# Patient Record
Sex: Male | Born: 1960 | Race: Black or African American | Hispanic: No | Marital: Single | Smoking: Former smoker
Health system: Southern US, Community
[De-identification: ages and names within clinical notes are randomized; demographics above are authoritative.]

## PROBLEM LIST (undated history)

## (undated) DIAGNOSIS — S4292XA Fracture of left shoulder girdle, part unspecified, initial encounter for closed fracture: Secondary | ICD-10-CM

## (undated) DIAGNOSIS — R030 Elevated blood-pressure reading, without diagnosis of hypertension: Secondary | ICD-10-CM

## (undated) DIAGNOSIS — IMO0001 Reserved for inherently not codable concepts without codable children: Secondary | ICD-10-CM

## (undated) DIAGNOSIS — A159 Respiratory tuberculosis unspecified: Secondary | ICD-10-CM

## (undated) DIAGNOSIS — K2970 Gastritis, unspecified, without bleeding: Secondary | ICD-10-CM

## (undated) DIAGNOSIS — J310 Chronic rhinitis: Secondary | ICD-10-CM

## (undated) DIAGNOSIS — D126 Benign neoplasm of colon, unspecified: Secondary | ICD-10-CM

## (undated) DIAGNOSIS — R51 Headache: Secondary | ICD-10-CM

## (undated) DIAGNOSIS — R7401 Elevation of levels of liver transaminase levels: Secondary | ICD-10-CM

## (undated) DIAGNOSIS — R740 Nonspecific elevation of levels of transaminase and lactic acid dehydrogenase [LDH]: Secondary | ICD-10-CM

## (undated) DIAGNOSIS — L309 Dermatitis, unspecified: Secondary | ICD-10-CM

## (undated) DIAGNOSIS — Z720 Tobacco use: Secondary | ICD-10-CM

## (undated) HISTORY — DX: Benign neoplasm of colon, unspecified: D12.6

## (undated) HISTORY — DX: Elevated blood-pressure reading, without diagnosis of hypertension: R03.0

## (undated) HISTORY — DX: Dermatitis, unspecified: L30.9

## (undated) HISTORY — DX: Elevation of levels of liver transaminase levels: R74.01

## (undated) HISTORY — DX: Headache: R51

## (undated) HISTORY — DX: Reserved for inherently not codable concepts without codable children: IMO0001

## (undated) HISTORY — DX: Gastritis, unspecified, without bleeding: K29.70

## (undated) HISTORY — DX: Chronic rhinitis: J31.0

## (undated) HISTORY — DX: Respiratory tuberculosis unspecified: A15.9

## (undated) HISTORY — DX: Nonspecific elevation of levels of transaminase and lactic acid dehydrogenase (ldh): R74.0

## (undated) HISTORY — DX: Tobacco use: Z72.0

## (undated) HISTORY — PX: NO PAST SURGERIES: SHX2092

## (undated) HISTORY — DX: Fracture of left shoulder girdle, part unspecified, initial encounter for closed fracture: S42.92XA

---

## 1994-09-07 DIAGNOSIS — A159 Respiratory tuberculosis unspecified: Secondary | ICD-10-CM

## 1994-09-07 HISTORY — DX: Respiratory tuberculosis unspecified: A15.9

## 1995-02-05 DIAGNOSIS — Z8611 Personal history of tuberculosis: Secondary | ICD-10-CM

## 1998-04-25 ENCOUNTER — Encounter: Admission: RE | Admit: 1998-04-25 | Discharge: 1998-04-25 | Payer: Self-pay | Admitting: Internal Medicine

## 2000-01-02 ENCOUNTER — Encounter: Admission: RE | Admit: 2000-01-02 | Discharge: 2000-01-02 | Payer: Self-pay | Admitting: Internal Medicine

## 2000-12-02 ENCOUNTER — Encounter: Admission: RE | Admit: 2000-12-02 | Discharge: 2000-12-02 | Payer: Self-pay | Admitting: Internal Medicine

## 2004-07-09 ENCOUNTER — Ambulatory Visit: Payer: Self-pay | Admitting: Internal Medicine

## 2004-07-09 ENCOUNTER — Ambulatory Visit (HOSPITAL_COMMUNITY): Admission: RE | Admit: 2004-07-09 | Discharge: 2004-07-09 | Payer: Self-pay | Admitting: Internal Medicine

## 2004-07-23 ENCOUNTER — Ambulatory Visit: Payer: Self-pay | Admitting: Internal Medicine

## 2005-01-07 ENCOUNTER — Ambulatory Visit: Payer: Self-pay | Admitting: Internal Medicine

## 2005-07-14 ENCOUNTER — Ambulatory Visit: Payer: Self-pay | Admitting: Internal Medicine

## 2005-09-07 DIAGNOSIS — R519 Headache, unspecified: Secondary | ICD-10-CM

## 2005-09-07 HISTORY — DX: Headache, unspecified: R51.9

## 2005-09-14 ENCOUNTER — Ambulatory Visit (HOSPITAL_COMMUNITY): Admission: RE | Admit: 2005-09-14 | Discharge: 2005-09-14 | Payer: Self-pay | Admitting: Ophthalmology

## 2005-09-16 ENCOUNTER — Ambulatory Visit: Payer: Self-pay | Admitting: Internal Medicine

## 2005-09-16 ENCOUNTER — Ambulatory Visit (HOSPITAL_COMMUNITY): Admission: RE | Admit: 2005-09-16 | Discharge: 2005-09-16 | Payer: Self-pay | Admitting: Internal Medicine

## 2005-10-14 ENCOUNTER — Ambulatory Visit: Payer: Self-pay | Admitting: Internal Medicine

## 2005-10-21 ENCOUNTER — Ambulatory Visit (HOSPITAL_COMMUNITY): Admission: RE | Admit: 2005-10-21 | Discharge: 2005-10-21 | Payer: Self-pay | Admitting: *Deleted

## 2005-10-28 ENCOUNTER — Ambulatory Visit: Payer: Self-pay | Admitting: Internal Medicine

## 2006-07-16 DIAGNOSIS — L259 Unspecified contact dermatitis, unspecified cause: Secondary | ICD-10-CM

## 2006-07-16 DIAGNOSIS — J310 Chronic rhinitis: Secondary | ICD-10-CM | POA: Insufficient documentation

## 2007-12-14 ENCOUNTER — Ambulatory Visit: Payer: Self-pay | Admitting: Internal Medicine

## 2007-12-14 DIAGNOSIS — R519 Headache, unspecified: Secondary | ICD-10-CM | POA: Insufficient documentation

## 2007-12-14 DIAGNOSIS — R51 Headache: Secondary | ICD-10-CM

## 2007-12-29 ENCOUNTER — Telehealth: Payer: Self-pay | Admitting: Licensed Clinical Social Worker

## 2007-12-30 ENCOUNTER — Encounter: Payer: Self-pay | Admitting: Licensed Clinical Social Worker

## 2008-01-11 DIAGNOSIS — R7401 Elevation of levels of liver transaminase levels: Secondary | ICD-10-CM | POA: Insufficient documentation

## 2008-01-11 DIAGNOSIS — R740 Nonspecific elevation of levels of transaminase and lactic acid dehydrogenase [LDH]: Secondary | ICD-10-CM

## 2008-01-11 LAB — CONVERTED CEMR LAB
ALT: 55 units/L — ABNORMAL HIGH (ref 0–53)
AST: 40 units/L — ABNORMAL HIGH (ref 0–37)
Albumin: 5 g/dL (ref 3.5–5.2)
Alkaline Phosphatase: 109 units/L (ref 39–117)
BUN: 10 mg/dL (ref 6–23)
Bacteria, UA: NONE SEEN
Basophils Absolute: 0.1 10*3/uL (ref 0.0–0.1)
Basophils Relative: 1 % (ref 0–1)
Bilirubin Urine: NEGATIVE
CO2: 27 meq/L (ref 19–32)
Calcium: 10.1 mg/dL (ref 8.4–10.5)
Chloride: 101 meq/L (ref 96–112)
Creatinine, Ser: 0.66 mg/dL (ref 0.40–1.50)
Eosinophils Absolute: 0.5 10*3/uL (ref 0.0–0.7)
Eosinophils Relative: 10 % — ABNORMAL HIGH (ref 0–5)
Glucose, Bld: 87 mg/dL (ref 70–99)
HCT: 46.6 % (ref 39.0–52.0)
Hemoglobin, Urine: NEGATIVE
Hemoglobin: 15.2 g/dL (ref 13.0–17.0)
Ketones, ur: NEGATIVE mg/dL
Leukocytes, UA: NEGATIVE
Lymphocytes Relative: 44 % (ref 12–46)
Lymphs Abs: 2.5 10*3/uL (ref 0.7–4.0)
MCHC: 32.6 g/dL (ref 30.0–36.0)
MCV: 82 fL (ref 78.0–100.0)
Monocytes Absolute: 0.5 10*3/uL (ref 0.1–1.0)
Monocytes Relative: 10 % (ref 3–12)
Neutro Abs: 2 10*3/uL (ref 1.7–7.7)
Neutrophils Relative %: 36 % — ABNORMAL LOW (ref 43–77)
Nitrite: NEGATIVE
Platelets: 261 10*3/uL (ref 150–400)
Potassium: 4.6 meq/L (ref 3.5–5.3)
Protein, ur: 30 mg/dL — AB
RBC / HPF: NONE SEEN (ref <3)
RBC: 5.68 M/uL (ref 4.22–5.81)
RDW: 14.3 % (ref 11.5–15.5)
Sodium: 140 meq/L (ref 135–145)
Specific Gravity, Urine: 1.02 (ref 1.005–1.03)
Total Bilirubin: 0.8 mg/dL (ref 0.3–1.2)
Total Protein: 8.8 g/dL — ABNORMAL HIGH (ref 6.0–8.3)
Urine Glucose: NEGATIVE mg/dL
Urobilinogen, UA: 0.2 (ref 0.0–1.0)
WBC, UA: NONE SEEN cells/hpf (ref <3)
WBC: 5.6 10*3/uL (ref 4.0–10.5)
pH: 8 (ref 5.0–8.0)

## 2008-08-21 ENCOUNTER — Emergency Department (HOSPITAL_COMMUNITY): Admission: EM | Admit: 2008-08-21 | Discharge: 2008-08-21 | Payer: Self-pay | Admitting: Family Medicine

## 2008-09-19 ENCOUNTER — Ambulatory Visit: Payer: Self-pay | Admitting: Internal Medicine

## 2008-09-19 LAB — CONVERTED CEMR LAB
ALT: 44 units/L (ref 0–53)
AST: 33 units/L (ref 0–37)
Albumin: 4.5 g/dL (ref 3.5–5.2)
Alkaline Phosphatase: 89 units/L (ref 39–117)
BUN: 9 mg/dL (ref 6–23)
Basophils Absolute: 0.1 10*3/uL (ref 0.0–0.1)
Basophils Relative: 1 % (ref 0–1)
CO2: 22 meq/L (ref 19–32)
Calcium: 10 mg/dL (ref 8.4–10.5)
Chloride: 107 meq/L (ref 96–112)
Creatinine, Ser: 0.54 mg/dL (ref 0.40–1.50)
Eosinophils Absolute: 0.4 10*3/uL (ref 0.0–0.7)
Eosinophils Relative: 6 % — ABNORMAL HIGH (ref 0–5)
Glucose, Bld: 94 mg/dL (ref 70–99)
HCT: 45.4 % (ref 39.0–52.0)
Hemoglobin: 14.6 g/dL (ref 13.0–17.0)
Lymphocytes Relative: 37 % (ref 12–46)
Lymphs Abs: 2.1 10*3/uL (ref 0.7–4.0)
MCHC: 32.2 g/dL (ref 30.0–36.0)
MCV: 81.8 fL (ref 78.0–100.0)
Monocytes Absolute: 0.5 10*3/uL (ref 0.1–1.0)
Monocytes Relative: 8 % (ref 3–12)
Neutro Abs: 2.8 10*3/uL (ref 1.7–7.7)
Neutrophils Relative %: 48 % (ref 43–77)
Platelets: 291 10*3/uL (ref 150–400)
Potassium: 4 meq/L (ref 3.5–5.3)
RBC: 5.55 M/uL (ref 4.22–5.81)
RDW: 14.9 % (ref 11.5–15.5)
Sodium: 142 meq/L (ref 135–145)
Total Bilirubin: 0.5 mg/dL (ref 0.3–1.2)
Total Protein: 8 g/dL (ref 6.0–8.3)
WBC: 5.7 10*3/uL (ref 4.0–10.5)

## 2008-09-21 DIAGNOSIS — Z87891 Personal history of nicotine dependence: Secondary | ICD-10-CM

## 2008-09-21 DIAGNOSIS — I10 Essential (primary) hypertension: Secondary | ICD-10-CM | POA: Insufficient documentation

## 2009-08-19 ENCOUNTER — Ambulatory Visit: Payer: Self-pay | Admitting: Internal Medicine

## 2009-12-30 ENCOUNTER — Encounter (INDEPENDENT_AMBULATORY_CARE_PROVIDER_SITE_OTHER): Payer: Self-pay | Admitting: *Deleted

## 2010-06-02 ENCOUNTER — Ambulatory Visit: Payer: Self-pay | Admitting: Internal Medicine

## 2010-09-07 DIAGNOSIS — S4292XA Fracture of left shoulder girdle, part unspecified, initial encounter for closed fracture: Secondary | ICD-10-CM

## 2010-09-07 HISTORY — DX: Fracture of left shoulder girdle, part unspecified, initial encounter for closed fracture: S42.92XA

## 2010-10-07 NOTE — Assessment & Plan Note (Signed)
Summary: FLU/SB  Nurse Visit   Allergies: No Known Drug Allergies  Immunizations Administered:  Influenza Vaccine # 1:    Vaccine Type: Fluvax MCR    Site: right deltoid    Mfr: GlaxoSmithKline    Dose: 0.5 ml    Route: IM    Given by: Stanton Kidney Faye Sanfilippo RN    Exp. Date: 03/07/2011    Lot #: [id number]    VIS given: 04/01/10 version given June 02, 2010.  Flu Vaccine Consent Questions:    Do you have a history of severe allergic reactions to this vaccine? no    Any prior history of allergic reactions to egg and/or gelatin? no    Do you have a sensitivity to the preservative Thimersol? no    Do you have a past history of Guillan-Barre Syndrome? no    Do you currently have an acute febrile illness? no    Have you ever had a severe reaction to latex? no    Vaccine information given and explained to patient? yes  Orders Added: 1)  Influenza Vaccine MCR [00025]

## 2010-10-07 NOTE — Letter (Signed)
Summary: [facility] RECALL LETTER  All     ,     Phone:   Fax:     12/30/2009   Sydnee Cabal [place] [address] [place], [place]  [address]   Dear  [name],   You are due to follow-up with a doctor at the [facility].  We have been unable to contact you by phone.  If you would like to schedule a visit, please call [phone].  If you are receiving your health care somewhere else, please call us and we will take your name off our patient list.  Healthy regards,  Raynaldo Opitz, Director [facility]

## 2011-03-20 ENCOUNTER — Emergency Department (HOSPITAL_COMMUNITY): Payer: Medicare Other

## 2011-03-20 ENCOUNTER — Emergency Department (HOSPITAL_COMMUNITY)
Admission: EM | Admit: 2011-03-20 | Discharge: 2011-03-20 | Disposition: A | Discharge status: Home / Self Care | Payer: Medicare Other | Attending: Emergency Medicine | Admitting: Emergency Medicine

## 2011-03-20 DIAGNOSIS — Y93H2 Activity, gardening and landscaping: Secondary | ICD-10-CM | POA: Insufficient documentation

## 2011-03-20 DIAGNOSIS — W07XXXA Fall from chair, initial encounter: Secondary | ICD-10-CM | POA: Insufficient documentation

## 2011-03-20 DIAGNOSIS — M25519 Pain in unspecified shoulder: Secondary | ICD-10-CM | POA: Insufficient documentation

## 2011-03-20 DIAGNOSIS — S42293A Other displaced fracture of upper end of unspecified humerus, initial encounter for closed fracture: Secondary | ICD-10-CM | POA: Insufficient documentation

## 2011-05-26 ENCOUNTER — Ambulatory Visit: Payer: Medicare Other | Attending: Orthopedic Surgery

## 2011-05-26 ENCOUNTER — Ambulatory Visit: Payer: Medicare Other

## 2011-05-26 DIAGNOSIS — M25619 Stiffness of unspecified shoulder, not elsewhere classified: Secondary | ICD-10-CM | POA: Insufficient documentation

## 2011-05-26 DIAGNOSIS — M6281 Muscle weakness (generalized): Secondary | ICD-10-CM | POA: Insufficient documentation

## 2011-05-26 DIAGNOSIS — M25519 Pain in unspecified shoulder: Secondary | ICD-10-CM | POA: Insufficient documentation

## 2011-05-26 DIAGNOSIS — IMO0001 Reserved for inherently not codable concepts without codable children: Secondary | ICD-10-CM | POA: Insufficient documentation

## 2011-05-29 ENCOUNTER — Ambulatory Visit: Payer: Medicare Other

## 2011-06-01 ENCOUNTER — Ambulatory Visit: Payer: Medicare Other | Admitting: Rehabilitation

## 2011-06-03 ENCOUNTER — Ambulatory Visit: Payer: Medicare Other | Admitting: Rehabilitation

## 2011-06-08 ENCOUNTER — Ambulatory Visit: Payer: Medicare Other | Attending: Family Medicine | Admitting: Rehabilitation

## 2011-06-08 DIAGNOSIS — IMO0001 Reserved for inherently not codable concepts without codable children: Secondary | ICD-10-CM | POA: Insufficient documentation

## 2011-06-08 DIAGNOSIS — M25619 Stiffness of unspecified shoulder, not elsewhere classified: Secondary | ICD-10-CM | POA: Insufficient documentation

## 2011-06-08 DIAGNOSIS — M6281 Muscle weakness (generalized): Secondary | ICD-10-CM | POA: Insufficient documentation

## 2011-06-08 DIAGNOSIS — M25519 Pain in unspecified shoulder: Secondary | ICD-10-CM | POA: Insufficient documentation

## 2011-06-12 ENCOUNTER — Encounter: Payer: Medicare Other | Admitting: Rehabilitation

## 2011-06-17 ENCOUNTER — Ambulatory Visit: Payer: Medicare Other | Admitting: Physical Therapy

## 2011-06-24 ENCOUNTER — Ambulatory Visit: Payer: Medicare Other | Admitting: Physical Therapy

## 2011-06-26 ENCOUNTER — Ambulatory Visit: Payer: Medicare Other | Admitting: Rehabilitation

## 2011-06-30 ENCOUNTER — Ambulatory Visit: Payer: Medicare Other | Admitting: Rehabilitative and Restorative Service Providers"

## 2011-07-02 ENCOUNTER — Ambulatory Visit: Payer: Medicare Other | Admitting: Rehabilitation

## 2011-08-12 ENCOUNTER — Ambulatory Visit: Payer: Medicare Other | Admitting: Internal Medicine

## 2011-08-14 ENCOUNTER — Ambulatory Visit (INDEPENDENT_AMBULATORY_CARE_PROVIDER_SITE_OTHER): Payer: Medicare Other

## 2011-08-14 DIAGNOSIS — Z23 Encounter for immunization: Secondary | ICD-10-CM

## 2011-08-19 ENCOUNTER — Encounter: Payer: Self-pay | Admitting: Internal Medicine

## 2011-08-19 ENCOUNTER — Ambulatory Visit (INDEPENDENT_AMBULATORY_CARE_PROVIDER_SITE_OTHER): Payer: Medicare Other | Admitting: Internal Medicine

## 2011-08-19 VITALS — BP 160/102 | HR 70 | Temp 97.8°F | Ht 62.0 in | Wt 114.6 lb

## 2011-08-19 DIAGNOSIS — J310 Chronic rhinitis: Secondary | ICD-10-CM

## 2011-08-19 DIAGNOSIS — Z0000 Encounter for general adult medical examination without abnormal findings: Secondary | ICD-10-CM | POA: Insufficient documentation

## 2011-08-19 DIAGNOSIS — I10 Essential (primary) hypertension: Secondary | ICD-10-CM

## 2011-08-19 LAB — CBC WITH DIFFERENTIAL/PLATELET
Basophils Absolute: 0 10*3/uL (ref 0.0–0.1)
Basophils Relative: 1 % (ref 0–1)
Eosinophils Absolute: 0.2 10*3/uL (ref 0.0–0.7)
Eosinophils Relative: 3 % (ref 0–5)
HCT: 43 % (ref 39.0–52.0)
Hemoglobin: 14.2 g/dL (ref 13.0–17.0)
Lymphocytes Relative: 45 % (ref 12–46)
Lymphs Abs: 2 10*3/uL (ref 0.7–4.0)
MCH: 27.2 pg (ref 26.0–34.0)
MCHC: 33 g/dL (ref 30.0–36.0)
MCV: 82.4 fL (ref 78.0–100.0)
Monocytes Absolute: 0.4 10*3/uL (ref 0.1–1.0)
Monocytes Relative: 9 % (ref 3–12)
Neutro Abs: 1.9 10*3/uL (ref 1.7–7.7)
Neutrophils Relative %: 42 % — ABNORMAL LOW (ref 43–77)
Platelets: 282 10*3/uL (ref 150–400)
RBC: 5.22 MIL/uL (ref 4.22–5.81)
RDW: 14.8 % (ref 11.5–15.5)
WBC: 4.5 10*3/uL (ref 4.0–10.5)

## 2011-08-19 LAB — COMPLETE METABOLIC PANEL WITH GFR
ALT: 32 U/L (ref 0–53)
AST: 33 U/L (ref 0–37)
Albumin: 4.3 g/dL (ref 3.5–5.2)
Alkaline Phosphatase: 92 U/L (ref 39–117)
BUN: 9 mg/dL (ref 6–23)
CO2: 27 mEq/L (ref 19–32)
Calcium: 9.4 mg/dL (ref 8.4–10.5)
Chloride: 104 mEq/L (ref 96–112)
Creat: 0.57 mg/dL (ref 0.50–1.35)
GFR, Est African American: >89 mL/min
GFR, Est Non African American: >89 mL/min
Glucose, Bld: 85 mg/dL (ref 70–99)
Potassium: 4 mEq/L (ref 3.5–5.3)
Sodium: 140 mEq/L (ref 135–145)
Total Bilirubin: 0.5 mg/dL (ref 0.3–1.2)
Total Protein: 7.2 g/dL (ref 6.0–8.3)

## 2011-08-19 LAB — LIPID PANEL
Cholesterol: 185 mg/dL (ref 0–200)
HDL: 70 mg/dL (ref >39)
LDL Cholesterol: 102 mg/dL — ABNORMAL HIGH (ref 0–99)
Total CHOL/HDL Ratio: 2.6 Ratio
Triglycerides: 63 mg/dL (ref <150)
VLDL: 13 mg/dL (ref 0–40)

## 2011-08-19 MED ORDER — HYDROCHLOROTHIAZIDE 12.5 MG PO CAPS: 12.5000 mg | ORAL_CAPSULE | Freq: Every day | ORAL | Status: DC

## 2011-08-19 NOTE — Assessment & Plan Note (Signed)
Lab Results  Component Value Date   NA 142 09/19/2008   K 4.0 09/19/2008   CL 107 09/19/2008   CO2 22 09/19/2008   BUN 9 09/19/2008   CREATININE 0.54 09/19/2008    BP Readings from Last 3 Encounters:  08/19/11 160/102  09/19/08 134/93  12/14/07 130/80    Assessment: Patient has a history of intermittently elevated blood pressures in the past, and today had both systolic and diastolic elevations when rechecked.  He has not been on antihypertensive medication in the past.  Plan: The plan is to start HCTZ 12.5 mg daily; I discussed the diagnosis with patient and the importance of regular followup.  Will check labs today as ordered.

## 2011-08-19 NOTE — Progress Notes (Signed)
  Subjective:    Patient ID: Gregory Hernandez, male    DOB: 1961-03-03, 50 y.o.   MRN: [id number]  HPI Patient returns for followup of his chronic medical issues; his last visit here was in January of 2010.  He has no complaints today.  He reports that he broke his shoulder earlier this year and was treated at [facility] and subsequently by an orthopedic surgeon and by physical therapy; he says that he has healed well and currently has no shoulder pain or limitation of movement.  The record of that ED visit and shoulder x-ray were apparently under a different medical record number.  Patient is currently taking no medications.  He reports that he quit smoking a year ago.   Review of Systems  Constitutional: Negative for fever, chills, diaphoresis and appetite change.  Eyes: Negative for visual disturbance.  Respiratory: Negative for cough, shortness of breath and wheezing.   Cardiovascular: Negative for chest pain and leg swelling.  Gastrointestinal: Negative for nausea, vomiting, abdominal pain, diarrhea and blood in stool.  Genitourinary: Negative for dysuria, frequency and difficulty urinating.  Musculoskeletal: Negative for back pain and arthralgias.  Skin: Negative for rash.  Neurological: Negative for dizziness, weakness, numbness and headaches.  Psychiatric/Behavioral: Negative for dysphoric mood.       Objective:   Physical Exam  Constitutional: No distress.  Cardiovascular: Normal rate, regular rhythm and normal heart sounds.  Exam reveals no gallop and no friction rub.   No murmur heard. Pulmonary/Chest: Effort normal and breath sounds normal. No respiratory distress. He has no wheezes.  Abdominal: Soft. Bowel sounds are normal. He exhibits no distension and no mass. There is no hepatosplenomegaly. There is no tenderness. There is no rebound and no guarding.  Musculoskeletal: He exhibits no edema.          Assessment & Plan:

## 2011-08-19 NOTE — Patient Instructions (Signed)
Start hydrochlorothiazide 12.5 mg one capsule daily.

## 2011-08-19 NOTE — Assessment & Plan Note (Signed)
Patient has seasonal symptoms, and currently is asymptomatic on no treatment.

## 2011-08-19 NOTE — Assessment & Plan Note (Signed)
I discussed screening colonoscopy with patient today, and he would like to consider this prior to our scheduling it.  The plan is to discuss this again when he follows up next month.

## 2011-10-14 ENCOUNTER — Ambulatory Visit: Payer: Medicare Other | Admitting: Internal Medicine

## 2011-11-25 ENCOUNTER — Ambulatory Visit (INDEPENDENT_AMBULATORY_CARE_PROVIDER_SITE_OTHER): Payer: Medicare Other | Admitting: Internal Medicine

## 2011-11-25 ENCOUNTER — Encounter: Payer: Self-pay | Admitting: Internal Medicine

## 2011-11-25 VITALS — BP 146/94 | HR 84 | Temp 97.5°F | Ht 62.0 in | Wt 106.8 lb

## 2011-11-25 DIAGNOSIS — Z0000 Encounter for general adult medical examination without abnormal findings: Secondary | ICD-10-CM

## 2011-11-25 DIAGNOSIS — I10 Essential (primary) hypertension: Secondary | ICD-10-CM

## 2011-11-25 DIAGNOSIS — Z1211 Encounter for screening for malignant neoplasm of colon: Secondary | ICD-10-CM

## 2011-11-25 LAB — BASIC METABOLIC PANEL WITH GFR
BUN: 11 mg/dL (ref 6–23)
CO2: 26 mEq/L (ref 19–32)
Calcium: 9.9 mg/dL (ref 8.4–10.5)
Chloride: 101 mEq/L (ref 96–112)
Creat: 0.61 mg/dL (ref 0.50–1.35)
GFR, Est African American: >89 mL/min
GFR, Est Non African American: >89 mL/min
Glucose, Bld: 97 mg/dL (ref 70–99)
Potassium: 3.8 mEq/L (ref 3.5–5.3)
Sodium: 139 mEq/L (ref 135–145)

## 2011-11-25 MED ORDER — HYDROCHLOROTHIAZIDE 25 MG PO TABS: 25.0000 mg | ORAL_TABLET | Freq: Every day | ORAL | Status: DC

## 2011-11-25 NOTE — Progress Notes (Signed)
  Subjective:    Patient ID: Gregory Hernandez, male    DOB: 10-05-1960, 51 y.o.   MRN: [id number]  HPI Patient returns for followup of his blood pressure.  He has no complaints today.  He reports that he has been taking HCTZ 12.5 mg as prescribed with no apparent problems.  He has decided that he would like to proceed with screening colonoscopy.   Review of Systems  Constitutional: Negative for fever, chills, diaphoresis, appetite change and unexpected weight change.  Respiratory: Negative for cough and shortness of breath.   Cardiovascular: Negative for chest pain and leg swelling.  Gastrointestinal: Negative for nausea, vomiting, abdominal pain and blood in stool.  Genitourinary: Negative for dysuria and difficulty urinating.  Musculoskeletal: Negative for myalgias and arthralgias.  Psychiatric/Behavioral: Negative for dysphoric mood.       Objective:   Physical Exam  Constitutional: No distress.  Cardiovascular: Normal rate, regular rhythm and normal heart sounds.  Exam reveals no gallop and no friction rub.   No murmur heard. Pulmonary/Chest: Effort normal and breath sounds normal. He has no wheezes. He has no rales.  Abdominal: Soft. Bowel sounds are normal. He exhibits no distension and no mass. There is no hepatosplenomegaly. There is no tenderness. There is no rebound and no guarding.  Musculoskeletal: He exhibits no edema.        Assessment & Plan:

## 2011-11-25 NOTE — Assessment & Plan Note (Signed)
Lab Results  Component Value Date   NA 140 08/19/2011   K 4.0 08/19/2011   CL 104 08/19/2011   CO2 27 08/19/2011   BUN 9 08/19/2011   CREATININE 0.57 08/19/2011   CREATININE 0.54 09/19/2008    BP Readings from Last 3 Encounters:  11/25/11 146/94  08/19/11 160/102  09/19/08 134/93    Assessment: Hypertension control:  mildly elevated  Progress toward goals:  improved Barriers to meeting goals:  no barriers identified  Plan: Increase HCTZ to a dose of 25 mg daily; check basic metabolic panel today.

## 2011-11-25 NOTE — Assessment & Plan Note (Signed)
Patient has decided to proceed with screening colonoscopy; a referral to gastroenterology was placed.

## 2011-11-25 NOTE — Patient Instructions (Signed)
Start HCTZ 25 mg one tablet daily for high blood pressure. Stop the previous dose of HCTZ 12.5 mg daily.

## 2011-12-14 DIAGNOSIS — D126 Benign neoplasm of colon, unspecified: Secondary | ICD-10-CM

## 2011-12-14 DIAGNOSIS — K648 Other hemorrhoids: Secondary | ICD-10-CM | POA: Diagnosis not present

## 2011-12-14 DIAGNOSIS — Z1211 Encounter for screening for malignant neoplasm of colon: Secondary | ICD-10-CM | POA: Diagnosis not present

## 2011-12-14 HISTORY — DX: Benign neoplasm of colon, unspecified: D12.6

## 2012-01-20 ENCOUNTER — Encounter: Payer: Self-pay | Admitting: Internal Medicine

## 2012-09-12 ENCOUNTER — Ambulatory Visit: Payer: Medicare Other

## 2012-09-27 ENCOUNTER — Ambulatory Visit (INDEPENDENT_AMBULATORY_CARE_PROVIDER_SITE_OTHER): Payer: Medicare Other | Admitting: *Deleted

## 2012-09-27 DIAGNOSIS — Z23 Encounter for immunization: Secondary | ICD-10-CM

## 2013-03-11 ENCOUNTER — Other Ambulatory Visit: Payer: Self-pay | Admitting: Internal Medicine

## 2013-03-13 NOTE — Telephone Encounter (Signed)
I refilled for 1 month.  Please schedule a follow-up appointment within 1 month (any provider).

## 2013-04-12 ENCOUNTER — Ambulatory Visit (INDEPENDENT_AMBULATORY_CARE_PROVIDER_SITE_OTHER): Payer: Medicare Other | Admitting: Internal Medicine

## 2013-04-12 ENCOUNTER — Encounter: Payer: Self-pay | Admitting: Internal Medicine

## 2013-04-12 VITALS — BP 138/85 | HR 84 | Temp 97.1°F | Wt 107.0 lb

## 2013-04-12 DIAGNOSIS — I10 Essential (primary) hypertension: Secondary | ICD-10-CM | POA: Diagnosis not present

## 2013-04-12 DIAGNOSIS — Z0000 Encounter for general adult medical examination without abnormal findings: Secondary | ICD-10-CM

## 2013-04-12 LAB — COMPLETE METABOLIC PANEL WITH GFR
ALT: 35 U/L (ref 0–53)
AST: 37 U/L (ref 0–37)
Albumin: 4.4 g/dL (ref 3.5–5.2)
Alkaline Phosphatase: 93 U/L (ref 39–117)
BUN: 6 mg/dL (ref 6–23)
CO2: 31 mEq/L (ref 19–32)
Calcium: 9.8 mg/dL (ref 8.4–10.5)
Chloride: 106 mEq/L (ref 96–112)
Creat: 0.59 mg/dL (ref 0.50–1.35)
GFR, Est African American: >89 mL/min
GFR, Est Non African American: >89 mL/min
Glucose, Bld: 87 mg/dL (ref 70–99)
Potassium: 4 mEq/L (ref 3.5–5.3)
Sodium: 144 mEq/L (ref 135–145)
Total Bilirubin: 0.5 mg/dL (ref 0.3–1.2)
Total Protein: 7.7 g/dL (ref 6.0–8.3)

## 2013-04-12 LAB — CBC WITH DIFFERENTIAL/PLATELET
Basophils Absolute: 0 10*3/uL (ref 0.0–0.1)
Basophils Relative: 1 % (ref 0–1)
Eosinophils Absolute: 0.3 10*3/uL (ref 0.0–0.7)
Eosinophils Relative: 9 % — ABNORMAL HIGH (ref 0–5)
HCT: 43.8 % (ref 39.0–52.0)
Hemoglobin: 14.5 g/dL (ref 13.0–17.0)
Lymphocytes Relative: 51 % — ABNORMAL HIGH (ref 12–46)
Lymphs Abs: 2 10*3/uL (ref 0.7–4.0)
MCH: 27.5 pg (ref 26.0–34.0)
MCHC: 33.1 g/dL (ref 30.0–36.0)
MCV: 83.1 fL (ref 78.0–100.0)
Monocytes Absolute: 0.4 10*3/uL (ref 0.1–1.0)
Monocytes Relative: 9 % (ref 3–12)
Neutro Abs: 1.2 10*3/uL — ABNORMAL LOW (ref 1.7–7.7)
Neutrophils Relative %: 30 % — ABNORMAL LOW (ref 43–77)
Platelets: 278 10*3/uL (ref 150–400)
RBC: 5.27 MIL/uL (ref 4.22–5.81)
RDW: 14.4 % (ref 11.5–15.5)
WBC: 3.9 10*3/uL — ABNORMAL LOW (ref 4.0–10.5)

## 2013-04-12 LAB — URINALYSIS, ROUTINE W REFLEX MICROSCOPIC
Bilirubin Urine: NEGATIVE
Glucose, UA: NEGATIVE mg/dL
Hgb urine dipstick: NEGATIVE
Ketones, ur: NEGATIVE mg/dL
Leukocytes, UA: NEGATIVE
Nitrite: NEGATIVE
Protein, ur: NEGATIVE mg/dL
Specific Gravity, Urine: 1.016 (ref 1.005–1.030)
Urobilinogen, UA: 0.2 mg/dL (ref 0.0–1.0)
pH: 6 (ref 5.0–8.0)

## 2013-04-12 LAB — LIPID PANEL
Cholesterol: 213 mg/dL — ABNORMAL HIGH (ref 0–200)
HDL: 91 mg/dL (ref >39)
LDL Cholesterol: 107 mg/dL — ABNORMAL HIGH (ref 0–99)
Total CHOL/HDL Ratio: 2.3 Ratio
Triglycerides: 73 mg/dL (ref <150)
VLDL: 15 mg/dL (ref 0–40)

## 2013-04-12 MED ORDER — HYDROCHLOROTHIAZIDE 25 MG PO TABS: 25.0000 mg | ORAL_TABLET | Freq: Every day | ORAL | Status: DC

## 2013-04-12 NOTE — Assessment & Plan Note (Signed)
Patient is due for a tetanus booster, but does not have Medicare part D coverage.  We advised him to talk with our social worker about enrolling in Medicare part D, and the plan then is to give a prescription for TDAP vaccine to be taken to a pharmacy for administration, which apparently will be covered by Medicare part D.

## 2013-04-12 NOTE — Patient Instructions (Signed)
General Instructions: Continue current medication.   Progress Toward Treatment Goals:  Treatment Goal 04/12/2013  Blood pressure at goal    Self Care Goals & Plans:  Self Care Goal 04/12/2013  Manage my medications take my medicines as prescribed; refill my medications on time; bring my medications to every visit  Eat healthy foods eat foods that are low in salt; eat baked foods instead of fried foods  Be physically active find an activity I enjoy       Care Management & Community Referrals:  Referral 04/12/2013  Referrals made for care management support none needed  Referrals made to community resources none

## 2013-04-12 NOTE — Progress Notes (Signed)
  Subjective:    Patient ID: Gregory Hernandez, male    DOB: 1961/02/09, 52 y.o.   MRN: [id number]  HPI Patient returns for followup of his hypertension and other medical problems.  He has no complaints today, and reports that he has been doing well.  He has been out of his hydrochlorothiazide for about one month; prior to that, he reports being compliant with his medication and had no apparent side effects.  He is a former smoker.   Review of Systems  Constitutional: Negative for fever, chills, diaphoresis, activity change and appetite change.  Respiratory: Negative for cough and shortness of breath.   Cardiovascular: Negative for chest pain and leg swelling.  Gastrointestinal: Negative for nausea, vomiting, abdominal pain, blood in stool and anal bleeding.  Genitourinary: Negative for dysuria, frequency and difficulty urinating.  Musculoskeletal: Negative for arthralgias.       Objective:   Physical Exam  Constitutional: No distress.  Cardiovascular: Normal rate, regular rhythm and normal heart sounds.  Exam reveals no gallop and no friction rub.   No murmur heard. Pulmonary/Chest: Effort normal and breath sounds normal. No respiratory distress. He has no wheezes. He has no rales.  Abdominal: Soft. Bowel sounds are normal. He exhibits no distension. There is no hepatosplenomegaly. There is no tenderness. There is no rebound and no guarding.  Musculoskeletal: He exhibits no edema.       Assessment & Plan:

## 2013-04-12 NOTE — Assessment & Plan Note (Signed)
BP Readings from Last 3 Encounters:  04/12/13 138/85  11/25/11 146/94  08/19/11 160/102    Lab Results  Component Value Date   NA 139 11/25/2011   K 3.8 11/25/2011   CREATININE 0.61 11/25/2011    Assessment: Blood pressure control: controlled Progress toward BP goal:  at goal Comments: Patient has been doing well on hydrochlorothiazide 25 mg daily with no apparent side effects; he has been out of medication for about one month.  Plan: Medications:  continue current medications Educational resources provided: brochure;video Self management tools provided: home blood pressure logbook Other plans: Check labs today including a metabolic panel, CBC with differential, urinalysis, and lipid panel

## 2013-04-21 ENCOUNTER — Other Ambulatory Visit: Payer: Self-pay | Admitting: Internal Medicine

## 2013-04-21 DIAGNOSIS — D709 Neutropenia, unspecified: Secondary | ICD-10-CM | POA: Insufficient documentation

## 2013-04-21 NOTE — Progress Notes (Signed)
Quick Note:  WBC is somewhat low; plan is repeat CBC with differential in 1 month. ______

## 2013-04-21 NOTE — Progress Notes (Signed)
WBC on 8/6 was somewhat low; plan is repeat CBC with differential in 1 month.

## 2013-09-07 ENCOUNTER — Other Ambulatory Visit: Payer: Self-pay | Admitting: Internal Medicine

## 2013-10-30 DIAGNOSIS — F985 Adult onset fluency disorder: Secondary | ICD-10-CM | POA: Diagnosis not present

## 2013-11-07 DIAGNOSIS — F985 Adult onset fluency disorder: Secondary | ICD-10-CM | POA: Diagnosis not present

## 2013-11-15 DIAGNOSIS — F985 Adult onset fluency disorder: Secondary | ICD-10-CM | POA: Diagnosis not present

## 2013-11-22 DIAGNOSIS — F985 Adult onset fluency disorder: Secondary | ICD-10-CM | POA: Diagnosis not present

## 2013-11-29 DIAGNOSIS — F985 Adult onset fluency disorder: Secondary | ICD-10-CM | POA: Diagnosis not present

## 2014-01-26 ENCOUNTER — Encounter: Payer: Self-pay | Admitting: Licensed Clinical Social Worker
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
## 2014-02-20 ENCOUNTER — Encounter: Payer: Self-pay | Admitting: Licensed Clinical Social Worker

## 2014-05-23 ENCOUNTER — Encounter: Payer: Self-pay | Admitting: Internal Medicine

## 2014-05-23 ENCOUNTER — Ambulatory Visit (INDEPENDENT_AMBULATORY_CARE_PROVIDER_SITE_OTHER): Payer: Medicare Other | Admitting: Internal Medicine

## 2014-05-23 VITALS — BP 142/81 | HR 77 | Temp 97.6°F | Ht 62.0 in | Wt 102.8 lb

## 2014-05-23 DIAGNOSIS — D709 Neutropenia, unspecified: Secondary | ICD-10-CM | POA: Diagnosis not present

## 2014-05-23 DIAGNOSIS — R634 Abnormal weight loss: Secondary | ICD-10-CM | POA: Insufficient documentation

## 2014-05-23 DIAGNOSIS — I10 Essential (primary) hypertension: Secondary | ICD-10-CM

## 2014-05-23 DIAGNOSIS — Z0000 Encounter for general adult medical examination without abnormal findings: Secondary | ICD-10-CM | POA: Diagnosis not present

## 2014-05-23 LAB — COMPLETE METABOLIC PANEL WITH GFR
ALT: 30 U/L (ref 0–53)
AST: 38 U/L — ABNORMAL HIGH (ref 0–37)
Albumin: 4.5 g/dL (ref 3.5–5.2)
Alkaline Phosphatase: 78 U/L (ref 39–117)
BUN: 6 mg/dL (ref 6–23)
CO2: 28 mEq/L (ref 19–32)
Calcium: 10.3 mg/dL (ref 8.4–10.5)
Chloride: 99 mEq/L (ref 96–112)
Creat: 0.53 mg/dL (ref 0.50–1.35)
GFR, Est African American: >89 mL/min
GFR, Est Non African American: >89 mL/min
Glucose, Bld: 85 mg/dL (ref 70–99)
Potassium: 3.8 mEq/L (ref 3.5–5.3)
Sodium: 139 mEq/L (ref 135–145)
Total Bilirubin: 0.9 mg/dL (ref 0.2–1.2)
Total Protein: 7.8 g/dL (ref 6.0–8.3)

## 2014-05-23 LAB — CBC WITH DIFFERENTIAL/PLATELET
Basophils Absolute: 0.1 10*3/uL (ref 0.0–0.1)
Basophils Relative: 1 % (ref 0–1)
Eosinophils Absolute: 0.3 10*3/uL (ref 0.0–0.7)
Eosinophils Relative: 5 % (ref 0–5)
HCT: 42.9 % (ref 39.0–52.0)
Hemoglobin: 14.3 g/dL (ref 13.0–17.0)
Lymphocytes Relative: 44 % (ref 12–46)
Lymphs Abs: 2.3 10*3/uL (ref 0.7–4.0)
MCH: 28.3 pg (ref 26.0–34.0)
MCHC: 33.3 g/dL (ref 30.0–36.0)
MCV: 84.8 fL (ref 78.0–100.0)
Monocytes Absolute: 0.4 10*3/uL (ref 0.1–1.0)
Monocytes Relative: 8 % (ref 3–12)
Neutro Abs: 2.2 10*3/uL (ref 1.7–7.7)
Neutrophils Relative %: 42 % — ABNORMAL LOW (ref 43–77)
Platelets: 312 10*3/uL (ref 150–400)
RBC: 5.06 MIL/uL (ref 4.22–5.81)
RDW: 14.1 % (ref 11.5–15.5)
WBC: 5.2 10*3/uL (ref 4.0–10.5)

## 2014-05-23 NOTE — Progress Notes (Signed)
   Subjective:    Patient ID: Gregory Hernandez, male    DOB: May 28, 1961, 53 y.o.   MRN: [id number]  HPI Gregory Hernandez is a 52 year old man with PMH of HTN presenting for routine follow up. He has no complaints today and  is interested in starting to take a multivitamin.     Review of Systems  Constitutional: Positive for unexpected weight change. Negative for fever, chills, diaphoresis, activity change, appetite change and fatigue.  HENT: Negative for sore throat and trouble swallowing.   Respiratory: Negative for cough, shortness of breath and wheezing.   Cardiovascular: Negative for chest pain, palpitations and leg swelling.  Gastrointestinal: Negative for nausea, vomiting, abdominal pain, diarrhea, constipation and blood in stool.  Genitourinary: Negative for dysuria, frequency and difficulty urinating.  Musculoskeletal: Negative for back pain and myalgias.  Skin: Negative for color change, pallor, rash and wound.  Neurological: Negative for dizziness, weakness, light-headedness and headaches.  Psychiatric/Behavioral: Negative for agitation.       Objective:   Physical Exam  Nursing note and vitals reviewed. Constitutional: He is oriented to person, place, and time. No distress.  Very thin.  Stutters   Eyes: No scleral icterus.  Wearing glasses  Neck:  Firm thyroid gland bilaterally  Cardiovascular: Normal rate and regular rhythm.   Pulmonary/Chest: Effort normal and breath sounds normal. No respiratory distress. He has no wheezes. He has no rales.  Abdominal: Soft. Bowel sounds are normal. He exhibits no distension and no mass. There is no tenderness. There is no rebound and no guarding.  Musculoskeletal: He exhibits no edema and no tenderness.  Lymphadenopathy:    He has no cervical adenopathy.  Neurological: He is alert and oriented to person, place, and time. Coordination normal.  Skin: Skin is warm and dry. No rash noted. He is not diaphoretic. No erythema. No pallor.    Psychiatric: He has a normal mood and affect.          Assessment & Plan:

## 2014-05-23 NOTE — Assessment & Plan Note (Signed)
Repeat CBC with diff today 

## 2014-05-23 NOTE — Assessment & Plan Note (Signed)
BP Readings from Last 3 Encounters:  05/23/14 142/81  04/12/13 138/85  11/25/11 146/94    Lab Results  Component Value Date   NA 144 04/12/2013   K 4.0 04/12/2013   CREATININE 0.59 04/12/2013    Assessment: Blood pressure control:  Controlled Progress toward BP goal:   only mildly elevated today Comments: On HCTZ 25mg  daily  Plan: Medications:  continue current medications Educational resources provided:   Self management tools provided:   Other plans: Follow up in 2 months.

## 2014-05-23 NOTE — Patient Instructions (Signed)
-  You may take any over-the-counter multivitamin that you would like.  -Follow up with Dr. Marinda Elk in 1-2 months for weight and blood pressure recheck.   Please bring your medicines with you each time you come.   Medicines may be  Eye drops  Herbal   Vitamins  Pills  Seeing these help [place] take care of you.

## 2014-05-23 NOTE — Assessment & Plan Note (Signed)
He received his flu vaccine during this visit.

## 2014-05-23 NOTE — Assessment & Plan Note (Addendum)
He states that his appetite is good. He has lost 5 lbs since August 6th. He denies fever/chills, cough, or nightsweats. He has hx of TB but this was treated at the [facility] in '96. Denies bowel changes, colonoscopy was in April 2013 with 1 tubular adenoma with no high grade dysplasia and recommended repeat colonoscopy in April 2018.  Denies alcohol or IV drug use, used to smoke but quit years ago. He is sexually active but wears condoms, denies hx of STI.  Thyroid gland is firm and pronounced on exam. CMP last month with nl LFTs -Repeat CBC with diff -HIV -TSH -CMP

## 2014-05-24 DIAGNOSIS — R634 Abnormal weight loss: Secondary | ICD-10-CM | POA: Diagnosis not present

## 2014-05-24 LAB — HIV ANTIBODY (ROUTINE TESTING W REFLEX): HIV 1&2 Ab, 4th Generation: NONREACTIVE

## 2014-05-24 NOTE — Progress Notes (Signed)
Medicine attending: Medical history, presenting problems, physical findings, and medications, reviewed with resident physician Dr. Hayes Ludwig and I concur with her management. Murriel Hopper, M.D., [place]

## 2014-05-25 ENCOUNTER — Telehealth: Payer: Self-pay | Admitting: Internal Medicine

## 2014-05-25 DIAGNOSIS — R7989 Other specified abnormal findings of blood chemistry: Secondary | ICD-10-CM

## 2014-05-25 LAB — TSH: TSH: 0.235 u[IU]/mL — ABNORMAL LOW (ref 0.350–4.500)

## 2014-05-25 NOTE — Telephone Encounter (Signed)
Called patient to inform him that his thyroid level is off. He said he will be able to come sometime next week.   I checked with the lab and unfortunately they do not have enough blood sample to run a fT3, fT4. His TSH was suppressed at 0.235.   Send message to the front desk for a lab visit to be scheduled. Ordered future order for free T3 and free T4.

## 2014-05-28 ENCOUNTER — Other Ambulatory Visit (INDEPENDENT_AMBULATORY_CARE_PROVIDER_SITE_OTHER): Payer: Medicare Other

## 2014-05-28 DIAGNOSIS — R946 Abnormal results of thyroid function studies: Secondary | ICD-10-CM

## 2014-05-28 DIAGNOSIS — R7989 Other specified abnormal findings of blood chemistry: Secondary | ICD-10-CM

## 2014-05-28 LAB — T4, FREE: Free T4: 0.89 ng/dL (ref 0.80–1.80)

## 2014-05-28 LAB — T3, FREE: T3, Free: 3.5 pg/mL (ref 2.3–4.2)

## 2014-06-04 ENCOUNTER — Encounter: Payer: Self-pay | Admitting: Internal Medicine

## 2014-06-04 DIAGNOSIS — E0590 Thyrotoxicosis, unspecified without thyrotoxic crisis or storm: Secondary | ICD-10-CM | POA: Insufficient documentation

## 2014-08-08 ENCOUNTER — Ambulatory Visit: Payer: Medicare Other | Admitting: Internal Medicine

## 2014-08-10 ENCOUNTER — Encounter: Payer: Self-pay | Admitting: Internal Medicine

## 2014-09-11 ENCOUNTER — Other Ambulatory Visit: Payer: Self-pay | Admitting: Internal Medicine

## 2014-09-19 ENCOUNTER — Encounter: Payer: Self-pay | Admitting: Internal Medicine

## 2014-09-19 ENCOUNTER — Ambulatory Visit (HOSPITAL_COMMUNITY)
Admission: RE | Admit: 2014-09-19 | Discharge: 2014-09-19 | Disposition: A | Discharge status: Home / Self Care | Payer: Medicare Other | Source: Ambulatory Visit | Attending: Internal Medicine | Admitting: Internal Medicine

## 2014-09-19 ENCOUNTER — Ambulatory Visit (INDEPENDENT_AMBULATORY_CARE_PROVIDER_SITE_OTHER): Payer: Medicare Other | Admitting: Internal Medicine

## 2014-09-19 VITALS — BP 132/85 | HR 76 | Temp 97.5°F | Ht 62.0 in | Wt 101.4 lb

## 2014-09-19 DIAGNOSIS — R740 Nonspecific elevation of levels of transaminase and lactic acid dehydrogenase [LDH]: Secondary | ICD-10-CM

## 2014-09-19 DIAGNOSIS — D126 Benign neoplasm of colon, unspecified: Secondary | ICD-10-CM | POA: Diagnosis not present

## 2014-09-19 DIAGNOSIS — Z87891 Personal history of nicotine dependence: Secondary | ICD-10-CM | POA: Diagnosis not present

## 2014-09-19 DIAGNOSIS — R05 Cough: Secondary | ICD-10-CM | POA: Insufficient documentation

## 2014-09-19 DIAGNOSIS — R634 Abnormal weight loss: Secondary | ICD-10-CM | POA: Diagnosis not present

## 2014-09-19 DIAGNOSIS — E0590 Thyrotoxicosis, unspecified without thyrotoxic crisis or storm: Secondary | ICD-10-CM

## 2014-09-19 DIAGNOSIS — Z125 Encounter for screening for malignant neoplasm of prostate: Secondary | ICD-10-CM

## 2014-09-19 DIAGNOSIS — R7401 Elevation of levels of liver transaminase levels: Secondary | ICD-10-CM

## 2014-09-19 DIAGNOSIS — I10 Essential (primary) hypertension: Secondary | ICD-10-CM

## 2014-09-19 DIAGNOSIS — J984 Other disorders of lung: Secondary | ICD-10-CM | POA: Diagnosis not present

## 2014-09-19 LAB — COMPLETE METABOLIC PANEL WITH GFR
ALT: 24 U/L (ref 0–53)
AST: 27 U/L (ref 0–37)
Albumin: 4.2 g/dL (ref 3.5–5.2)
Alkaline Phosphatase: 72 U/L (ref 39–117)
BUN: 8 mg/dL (ref 6–23)
CO2: 27 mEq/L (ref 19–32)
Calcium: 10.1 mg/dL (ref 8.4–10.5)
Chloride: 102 mEq/L (ref 96–112)
Creat: 0.58 mg/dL (ref 0.50–1.35)
GFR, Est African American: >89 mL/min
GFR, Est Non African American: >89 mL/min
Glucose, Bld: 83 mg/dL (ref 70–99)
Potassium: 3.9 mEq/L (ref 3.5–5.3)
Sodium: 140 mEq/L (ref 135–145)
Total Bilirubin: 1.4 mg/dL — ABNORMAL HIGH (ref 0.2–1.2)
Total Protein: 7.2 g/dL (ref 6.0–8.3)

## 2014-09-19 LAB — CBC WITH DIFFERENTIAL/PLATELET
Basophils Absolute: 0 10*3/uL (ref 0.0–0.1)
Basophils Relative: 1 % (ref 0–1)
Eosinophils Absolute: 0.2 10*3/uL (ref 0.0–0.7)
Eosinophils Relative: 7 % — ABNORMAL HIGH (ref 0–5)
HCT: 45.6 % (ref 39.0–52.0)
Hemoglobin: 14.9 g/dL (ref 13.0–17.0)
Lymphocytes Relative: 38 % (ref 12–46)
Lymphs Abs: 1.3 10*3/uL (ref 0.7–4.0)
MCH: 28.1 pg (ref 26.0–34.0)
MCHC: 32.7 g/dL (ref 30.0–36.0)
MCV: 85.9 fL (ref 78.0–100.0)
MPV: 9.9 fL (ref 8.6–12.4)
Monocytes Absolute: 0.4 10*3/uL (ref 0.1–1.0)
Monocytes Relative: 11 % (ref 3–12)
Neutro Abs: 1.5 10*3/uL — ABNORMAL LOW (ref 1.7–7.7)
Neutrophils Relative %: 43 % (ref 43–77)
Platelets: 295 10*3/uL (ref 150–400)
RBC: 5.31 MIL/uL (ref 4.22–5.81)
RDW: 14.4 % (ref 11.5–15.5)
WBC: 3.5 10*3/uL — ABNORMAL LOW (ref 4.0–10.5)

## 2014-09-19 LAB — GAMMA GT: GGT: 28 U/L (ref 7–51)

## 2014-09-19 LAB — T4, FREE: Free T4: 1.02 ng/dL (ref 0.80–1.80)

## 2014-09-19 LAB — TSH: TSH: 0.244 u[IU]/mL — ABNORMAL LOW (ref 0.350–4.500)

## 2014-09-19 NOTE — Assessment & Plan Note (Signed)
  Lab Results  Component Value Date   AST 38* 05/23/2014   ALT 30 05/23/2014   ALKPHOS 78 05/23/2014   BILITOT 0.9 05/23/2014   PROT 7.8 05/23/2014   ALBUMIN 4.5 05/23/2014    Assessment: Patient's AST was mildly elevated in September.  He reports alcohol consumption that at times is in the upper end of moderate range.  Plan: Check a comprehensive metabolic panel today.  I advised patient to reduce his alcohol intake.

## 2014-09-19 NOTE — Progress Notes (Signed)
   Subjective:    Patient ID: Gregory Hernandez, male    DOB: 1960/12/23, 54 y.o.   MRN: [id number]  HPI Patient returns for management of his weight loss, hypertension, and other chronic medical problems.  Today he has no acute complaints other than weight loss; he reports that his appetite is good and that he has been trying to gain weight.  He has no symptoms to suggest a source of his weight loss.  He reports alcohol consumption of up to 2-3 beers per day but not on a regular basis.   Review of Systems  Constitutional: Positive for unexpected weight change. Negative for fever, chills, diaphoresis and appetite change.  Respiratory: Negative for cough, shortness of breath and wheezing.   Cardiovascular: Negative for chest pain and leg swelling.  Gastrointestinal: Negative for vomiting, abdominal pain, diarrhea and blood in stool.  Genitourinary: Negative for dysuria and hematuria.  Musculoskeletal: Negative for myalgias, back pain and arthralgias.  Skin: Negative for rash.  Neurological: Negative for dizziness, syncope, weakness, numbness and headaches.  Hematological: Negative for adenopathy.   I reviewed and updated the medication list, allergies, past medical history, past surgical history, family history, and social history.     Objective:   Physical Exam  Constitutional: No distress.  Neck: Neck supple. No thyromegaly present.  Cardiovascular: Normal rate, regular rhythm and normal heart sounds.  Exam reveals no gallop and no friction rub.   No murmur heard. No leg edema.  Pulmonary/Chest: Effort normal and breath sounds normal. He has no wheezes. He has no rales.  Abdominal: Soft. Bowel sounds are normal. He exhibits no mass. There is no hepatosplenomegaly. There is no tenderness. There is no guarding and no CVA tenderness.  Lymphadenopathy:    He has no cervical adenopathy.    He has no axillary adenopathy.       Assessment & Plan:

## 2014-09-19 NOTE — Assessment & Plan Note (Signed)
Wt Readings from Last 6 Encounters:  09/19/14 101 lb 6.4 oz (45.995 kg)  05/23/14 102 lb 12.8 oz (46.63 kg)  04/12/13 107 lb (48.535 kg)  11/25/11 106 lb 12.8 oz (48.444 kg)  08/19/11 114 lb 9.6 oz (51.982 kg)  09/19/08 120 lb 8 oz (54.658 kg)     Assessment: Patient has unintentional weight loss of about 19 pounds over the past 6 years, and 6 pounds over the past 2 years.  He has no signs or symptoms that would point to a specific cause.  Labs done by Dr. Hayes Ludwig in September of this year were notable only for a mildly low TSH and a mild AST elevation; an HIV test was negative.  He has a history of tuberculosis treated in 1996, but does not have systemic symptoms of recurrence. A chest x-ray today showed hyperinflation consistent with COPD, stable parenchymal scarring on the right, and no evidence of acute abnormality.  He has a history of adenomatous colonic polyps by colonoscopy in 2013, with a repeat colonoscopy recommended for 2018.   Plan: Check labs including comprehensive metabolic panel, CBC with differential, ESR, and PSA; GI referral for consideration of repeat colonoscopy and upper endoscopy; repeat TSH and free T4.  If this workup is unrevealing, consider CT scan of the chest and/or abdomen.

## 2014-09-19 NOTE — Assessment & Plan Note (Signed)
Lab Results  Component Value Date   TSH 0.235* 05/24/2014   FREET4 0.89 05/28/2014    Assessment: Other than weight loss, patient has no symptoms to suggest hyperthyroidism.  Plan: Check TSH and free T4 today.

## 2014-09-19 NOTE — Assessment & Plan Note (Signed)
Assessment: Patient has history of adenomatous colonic polyp found on colonoscopy in 2013; a follow-up colonoscopy was recommended for 2018.  Plan: Given weight loss as noted above, refer to GI for consideration of repeat colonoscopy.

## 2014-09-19 NOTE — Patient Instructions (Signed)
To work up your weight loss, a chest x-ray and additional lab work have been ordered, and a referral has been made to your gastroenterologist to consider repeat colonoscopy and possible upper GI evaluation. Continue current medication.

## 2014-09-19 NOTE — Assessment & Plan Note (Signed)
BP Readings from Last 3 Encounters:  09/19/14 132/85  05/23/14 142/81  04/12/13 138/85    Lab Results  Component Value Date   NA 139 05/23/2014   K 3.8 05/23/2014   CREATININE 0.53 05/23/2014    Assessment: Blood pressure control: controlled Progress toward BP goal:  at goal Comments: Blood pressure has been controlled on hydrochlorothiazide 25 mg daily.  Patient reports not taking the medication recently, but also monitors his blood pressure at home and reports a systolic blood pressure in the 677C with a diastolic pressure in the 34K yesterday at home  Plan: Medications:  I advised patient to resume his hydrochlorothiazide 25 mg daily.

## 2014-09-20 LAB — SEDIMENTATION RATE: Sed Rate: 1 mm/h (ref 0–16)

## 2014-09-20 LAB — PSA: PSA: 0.96 ng/mL (ref <=4.00)

## 2014-09-25 NOTE — Progress Notes (Signed)
Quick Note:  Patient has stable subclinical hyperthyroidism. Plan is recheck TSH and free T4 upon return. ______

## 2014-09-25 NOTE — Progress Notes (Signed)
Quick Note:  Patient has mild neutropenia; this was noted about one year ago and then resolved. 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 Plan is recheck CBC with differential upon return. ______

## 2014-09-25 NOTE — Progress Notes (Signed)
Quick Note:  Total bilirubin is mildly elevated, possibly due to alcohol consumption. Plan is recheck upon return. ______

## 2014-10-15 DIAGNOSIS — Z86010 Personal history of colonic polyps: Secondary | ICD-10-CM | POA: Diagnosis not present

## 2014-10-15 DIAGNOSIS — R634 Abnormal weight loss: Secondary | ICD-10-CM | POA: Diagnosis not present

## 2014-11-01 DIAGNOSIS — K2900 Acute gastritis without bleeding: Secondary | ICD-10-CM | POA: Diagnosis not present

## 2014-11-01 DIAGNOSIS — B9681 Helicobacter pylori [H. pylori] as the cause of diseases classified elsewhere: Secondary | ICD-10-CM | POA: Diagnosis not present

## 2014-11-01 DIAGNOSIS — R634 Abnormal weight loss: Secondary | ICD-10-CM | POA: Diagnosis not present

## 2014-11-01 DIAGNOSIS — K2950 Unspecified chronic gastritis without bleeding: Secondary | ICD-10-CM | POA: Diagnosis not present

## 2014-11-06 ENCOUNTER — Telehealth: Payer: Self-pay | Admitting: Internal Medicine

## 2014-11-06 NOTE — Telephone Encounter (Signed)
Call to patient to confirm appointment for 11/07/14 at 10:45 lmtcb

## 2014-11-07 ENCOUNTER — Ambulatory Visit: Payer: Medicare Other | Admitting: Internal Medicine

## 2014-11-16 ENCOUNTER — Encounter: Payer: Self-pay | Admitting: Internal Medicine

## 2014-11-16 DIAGNOSIS — K2970 Gastritis, unspecified, without bleeding: Secondary | ICD-10-CM

## 2014-11-16 DIAGNOSIS — B9681 Helicobacter pylori [H. pylori] as the cause of diseases classified elsewhere: Secondary | ICD-10-CM | POA: Insufficient documentation

## 2014-11-16 HISTORY — DX: Gastritis, unspecified, without bleeding: K29.70

## 2014-11-23 ENCOUNTER — Encounter: Payer: Self-pay | Admitting: *Deleted

## 2014-11-29 ENCOUNTER — Telehealth: Payer: Self-pay | Admitting: Internal Medicine

## 2014-11-29 NOTE — Telephone Encounter (Signed)
Call to patient to confirm appointment for 12/03/14 at 10:15 phone is disconnected

## 2014-12-03 ENCOUNTER — Encounter: Payer: Self-pay | Admitting: Internal Medicine

## 2014-12-03 ENCOUNTER — Ambulatory Visit (INDEPENDENT_AMBULATORY_CARE_PROVIDER_SITE_OTHER): Payer: Medicare Other | Admitting: Internal Medicine

## 2014-12-03 VITALS — BP 132/81 | HR 70 | Temp 97.4°F | Ht 62.0 in | Wt 101.7 lb

## 2014-12-03 DIAGNOSIS — J310 Chronic rhinitis: Secondary | ICD-10-CM

## 2014-12-03 DIAGNOSIS — I10 Essential (primary) hypertension: Secondary | ICD-10-CM | POA: Diagnosis present

## 2014-12-03 DIAGNOSIS — R634 Abnormal weight loss: Secondary | ICD-10-CM | POA: Diagnosis not present

## 2014-12-03 DIAGNOSIS — K2970 Gastritis, unspecified, without bleeding: Secondary | ICD-10-CM

## 2014-12-03 DIAGNOSIS — B9681 Helicobacter pylori [H. pylori] as the cause of diseases classified elsewhere: Secondary | ICD-10-CM | POA: Diagnosis not present

## 2014-12-03 DIAGNOSIS — J302 Other seasonal allergic rhinitis: Secondary | ICD-10-CM

## 2014-12-03 DIAGNOSIS — J309 Allergic rhinitis, unspecified: Secondary | ICD-10-CM | POA: Insufficient documentation

## 2014-12-03 MED ORDER — LORATADINE 10 MG PO TABS: 10.0000 mg | ORAL_TABLET | Freq: Every day | ORAL | Status: DC

## 2014-12-03 NOTE — Assessment & Plan Note (Signed)
BP Readings from Last 3 Encounters:  12/03/14 132/81  09/19/14 132/85  05/23/14 142/81    Lab Results  Component Value Date   NA 140 09/19/2014   K 3.9 09/19/2014   CREATININE 0.58 09/19/2014    Assessment: Blood pressure control: controlled Progress toward BP goal:  at goal Comments: Well controlled on HCTZ 25 mg daily  Plan: Medications:  continue current medications Educational resources provided:   Self management tools provided:   Other plans: none

## 2014-12-03 NOTE — Assessment & Plan Note (Signed)
Gregory Hernandez has EGD 11/02/14 as part of work-up for weight loss which demonstrated gastritis. Biopsy was positive for H pylori and per the mother, Gregory Hernandez was on triple therapy (PPI/amoxicillin/clarithromycin) which was completed about two weeks ago (date unknown). While there should be confirmatory testing for eradication, this should not be performed until at least 4 weeks after completion of therapy so there is no false positive. He is not yet due for eradication testing -RTC in at least a month for eradication testing

## 2014-12-03 NOTE — Progress Notes (Signed)
   Subjective:    Patient ID: Gregory Hernandez, male    DOB: 04-28-1961, 54 y.o.   MRN: [id number]  HPI  [name] is a 54 year old with HTN, subclinical hyperthyroidism, weight loss here for follow-up on his unplanned weight loss. He was last seen 09/19/14 in [facility] for weight loss. Per records, he has lost 19 pounds in the past 6 years and 6 lbs in the past 2 years. He has a GI referral to consider repeat colonoscopy and endoscopy as well as a CMP wnl, CBC w WBC 3.5, TSH 0.24 but stable, ESR 1, PSA 0.76.  Since then, he feels "pretty good" and has no complaints. He says he has a good appetite and his mother notes that he is a good eater. He saw GI who sent him for colonoscopy and EGD 11/02/14. EGD noted patchy mild inflammation characterized by congestion and erosions in the gastric antrum. Biopsy returned positive for H pylori and he was started on triple therapy (he does not have it with him). He did not have adequate prep for colonoscopy so he would have to have repeat if we feel it is indicated.  Review of Systems  Constitutional: Negative for fever, chills, diaphoresis, activity change, appetite change and unexpected weight change.  HENT: Positive for congestion and rhinorrhea.   Respiratory: Negative for cough and shortness of breath.   Cardiovascular: Negative for chest pain.  Gastrointestinal: Negative for nausea, vomiting, abdominal pain, diarrhea and constipation.  Neurological: Negative for dizziness, weakness, light-headedness and numbness.       Objective:   Physical Exam  Constitutional: He is oriented to person, place, and time. He appears well-developed and well-nourished. No distress.  HENT:  Head: Normocephalic and atraumatic.  Mouth/Throat: Oropharynx is clear and moist. No oropharyngeal exudate.  Eyes: EOM are normal. Pupils are equal, round, and reactive to light.  Cardiovascular: Normal rate, regular rhythm, normal heart sounds and intact distal pulses.  Exam reveals no gallop  and no friction rub.   No murmur heard. Pulmonary/Chest: Effort normal and breath sounds normal. No respiratory distress. He has no wheezes.  Abdominal: Soft. He exhibits no distension. There is no tenderness.  Neurological: He is alert and oriented to person, place, and time.  Skin: He is not diaphoretic.  Vitals reviewed.         Assessment & Plan:

## 2014-12-03 NOTE — Assessment & Plan Note (Signed)
Gregory Hernandez was seen today for follow-up on his weight loss. His weight today was 101 lb 11 oz, which is stable from visit 09/19/14 when it was 101 lbs 6 oz. While he has lost 19 lbs since 2010, this was after gaining 10 lbs in 2009 when he was 110 lbs. Extensive work-up at previous visit noted CMP wnl, CBC w WBC 3.5, TSH 0.24 but stable, ESR 1, PSA 0.76. He had referral to GI. EGD performed 11/02/14 with H pylori positive gastritis but colonoscopy could not be performed due to inadequate prep. He has no change in appetite but requests prescription for Ensure today. As his weight is stable over ~ 3 months and he is only down 9 lbs from 2009, will not do any more testing today. He may have had some dyspepsia from the gastritis. He is to return in at least one month for H pylori eradication testing (see separate problem in today's note) and will be re-weighed at that time. I do not feel that as of now he needs a repeat colonoscopy until he is due at 2018 as his weight is stable from January and he has no symptoms/complaints. -Ensure 3 cases drink 1-2 bottles prn -RTC in about a month for re-weigh

## 2014-12-03 NOTE — Patient Instructions (Addendum)
It was a pleasure to see you today. Please return to clinic or seek medical attention if you have any new or worsening weight loss, belly issues, or other worrisome medical condition. We look forward to seeing you again soon.  Lottie Mussel, MD  General Instructions:   Please try to bring all your medicines next time. This will help [place] keep you safe from mistakes.   Progress Toward Treatment Goals:  Treatment Goal 12/03/2014  Blood pressure at goal    Self Care Goals & Plans:  Self Care Goal 12/03/2014  Manage my medications take my medicines as prescribed; bring my medications to every visit; refill my medications on time  Eat healthy foods drink diet soda or water instead of juice or soda; eat more vegetables; eat baked foods instead of fried foods  Be physically active take a walk every day  Meeting treatment goals -    No flowsheet data found.   Care Management & Community Referrals:  Referral 09/19/2014  Referrals made for care management support none needed  Referrals made to community resources none

## 2014-12-03 NOTE — Assessment & Plan Note (Signed)
Gregory Hernandez requested allergy medicine as he has seasonal allergies every year when spring begins. He has some congestion and rhinorrhea but otherwise ROS negative -loratidine 10 mg daily

## 2014-12-04 NOTE — Progress Notes (Signed)
Case discussed with Dr. Rothman at time of visit. We reviewed the resident's history and exam and pertinent patient test results. I agree with the assessment, diagnosis, and plan of care documented in the resident's note. 

## 2014-12-21 ENCOUNTER — Telehealth: Payer: Self-pay | Admitting: *Deleted

## 2014-12-21 NOTE — Telephone Encounter (Signed)
Mother of pt called - since 12/20/14 c/o of low back pain. Denies any problems with freq or burning according to mother -  Pt is currently out of house. Appt made 12/23/13 8:15AM Dr Alice Rieger. Hilda Blades [place] Alfredo RN 12/21/14 4:30PM

## 2014-12-24 ENCOUNTER — Ambulatory Visit (INDEPENDENT_AMBULATORY_CARE_PROVIDER_SITE_OTHER): Payer: Medicare Other | Admitting: Internal Medicine

## 2014-12-24 ENCOUNTER — Encounter: Payer: Self-pay | Admitting: Internal Medicine

## 2014-12-24 VITALS — BP 121/71 | HR 71 | Temp 97.9°F | Ht 62.0 in | Wt 99.6 lb

## 2014-12-24 DIAGNOSIS — J069 Acute upper respiratory infection, unspecified: Secondary | ICD-10-CM | POA: Insufficient documentation

## 2014-12-24 DIAGNOSIS — R634 Abnormal weight loss: Secondary | ICD-10-CM

## 2014-12-24 DIAGNOSIS — J310 Chronic rhinitis: Secondary | ICD-10-CM

## 2014-12-24 DIAGNOSIS — B9681 Helicobacter pylori [H. pylori] as the cause of diseases classified elsewhere: Secondary | ICD-10-CM | POA: Diagnosis not present

## 2014-12-24 DIAGNOSIS — I10 Essential (primary) hypertension: Secondary | ICD-10-CM | POA: Diagnosis not present

## 2014-12-24 DIAGNOSIS — K2970 Gastritis, unspecified, without bleeding: Principal | ICD-10-CM

## 2014-12-24 NOTE — Progress Notes (Signed)
Patient ID: Gregory Hernandez, male   DOB: 06-Aug-1961, 54 y.o.   MRN: [id number]   Subjective:   HPI: Gregory Hernandez is a 54 y.o. African-American gentleman with past medical history of hypertension, subclinical hypothyroidism, unexplained weight loss, and recent H. pylori gastritis , presents with his mother for an acute visit for low back pain.  Patient's mother called in for an acute visit 3 days ago because Gregory Hernandez had low back pain for a few days. The pain was mild, without any neurologic complaints. It was sharp, constant and nonradiating. No history of trauma. No exacerbating or relieving factors. However, the pain has resolved and he states that his pain 0 for the last 2 days. No urinary symptoms. No constitutional symptoms.   He also reports a cough which has been present over the past 5 days. Cough is productive of white sputum without hemoptysis. Marland KitchenHe attributes it to allergies. He takes Claritin at home. He does not have fevers, chills, or increased fatigue. His appetite has been normal. Denies chest pain. He has been using cough tablets, and his symptoms have not increased. No sore throat or other URI symptoms  There is concern of ongoing weight loss. Workup has been unremarkable to this point.   ROS: Constitutional: Denies fever, chills, diaphoresis, appetite change and fatigue.  Respiratory: Denies chest tightness, and wheezing.  CVS: No chest pain, palpitations and leg swelling.  GI: No abdominal pain, nausea, vomiting, bloody stools GU: No dysuria, frequency, hematuria, or flank pain.  MSK: No myalgias, back pain, joint swelling, arthralgias  Psych: No depression symptoms. No SI or SA.    Objective:  Physical Exam: Filed Vitals:   12/24/14 0823  BP: 121/71  Pulse: 71  Temp: 97.9 F (36.6 C)  TempSrc: Oral  Height: 5\' 2"  (1.575 m)  Weight: 99 lb 9.6 oz (45.178 kg)  SpO2: 100%   General: Thin appearing man. No acute distress.  HEENT: Normal oral mucosa. MMM.  Lungs:  CTA bilaterally. Heart: RRR; no extra sounds or murmurs  Abdomen: Non-distended, normal bowel sounds, soft, nontender; no hepatosplenomegaly  Extremities: No pedal edema. No joint swelling or tenderness. Neurologic: Normal EOM,  Alert and oriented x3. No obvious neurologic/cranial nerve deficits.  Assessment & Plan:  Discussed case with my attending in the clinic, Dr. Dareen Piano. See problem based charting.

## 2014-12-24 NOTE — Patient Instructions (Addendum)
We will check your urine, stool and blood.  I will call you with results.  Please take Ensure or Boost. You can buy this from [facility].  Please follow up with PCP in 1-2 months  Allergic Rhinitis Allergic rhinitis is when the mucous membranes in the nose respond to allergens. Allergens are particles in the air that cause your body to have an allergic reaction. This causes you to release allergic antibodies. Through a chain of events, these eventually cause you to release histamine into the blood stream. Although meant to protect the body, it is this release of histamine that causes your discomfort, such as frequent sneezing, congestion, and an itchy, runny nose.  CAUSES  Seasonal allergic rhinitis (hay fever) is caused by pollen allergens that may come from grasses, trees, and weeds. Year-round allergic rhinitis (perennial allergic rhinitis) is caused by allergens such as house dust mites, pet dander, and mold spores.  SYMPTOMS   Nasal stuffiness (congestion).  Itchy, runny nose with sneezing and tearing of the eyes. DIAGNOSIS  Your health care provider can help you determine the allergen or allergens that trigger your symptoms. If you and your health care provider are unable to determine the allergen, skin or blood testing may be used. TREATMENT  Allergic rhinitis does not have a cure, but it can be controlled by:  Medicines and allergy shots (immunotherapy).  Avoiding the allergen. Hay fever may often be treated with antihistamines in pill or nasal spray forms. Antihistamines block the effects of histamine. There are over-the-counter medicines that may help with nasal congestion and swelling around the eyes. Check with your health care provider before taking or giving this medicine.  If avoiding the allergen or the medicine prescribed do not work, there are many new medicines your health care provider can prescribe. Stronger medicine may be used if initial measures are ineffective.  Desensitizing injections can be used if medicine and avoidance does not work. Desensitization is when a patient is given ongoing shots until the body becomes less sensitive to the allergen. Make sure you follow up with your health care provider if problems continue. HOME CARE INSTRUCTIONS It is not possible to completely avoid allergens, but you can reduce your symptoms by taking steps to limit your exposure to them. It helps to know exactly what you are allergic to so that you can avoid your specific triggers. SEEK MEDICAL CARE IF:   You have a fever.  You develop a cough that does not stop easily (persistent).  You have shortness of breath.  You start wheezing.  Symptoms interfere with normal daily activities. Document Released: 05/19/2001 Document Revised: 08/29/2013 Document Reviewed: 05/01/2013 [facility] Patient Information 2015 [place], [place]. This information is not intended to replace advice given to you by your health care provider. Make sure you discuss any questions you have with your health care provider.

## 2014-12-24 NOTE — Assessment & Plan Note (Addendum)
Still complains of mild epigastric pain. No tenderness in epigastrium on physical exam. Plan -We'll check stool H. pylori antigen to document eradication of H pylori. Last abx in mid-March -PCP to consider colonoscopy to continue workup his weight loss.

## 2014-12-24 NOTE — Assessment & Plan Note (Signed)
His weight today was 99lb from 101 from his last visit. He has lost 19 lbs since 2010, this was after gaining 10 lbs in 2009 when he was 110 lbs. Extensive work-up at previous visits noted CMP wnl, CBC w WBC 3.5, TSH 0.24 but stable, ESR 1, PSA 0.76. He had referral to GI. EGD performed 11/02/14 with H pylori positive gastritis but colonoscopy could not be performed due to inadequate prep. No constitutional symptoms.  Plan -Due to recent complaints of low back pain, I'll go ahead and check SPEP and UPEP -Patient was unable to afford Ensure as his insurance does not cover it. I will encourage him to get it over-the-counter at [facility]. An alternative would be Boost -follow up with PCP in 1-2 month for further work up. Patient may require abdomen/pelvis, chest imaging.

## 2014-12-24 NOTE — Assessment & Plan Note (Signed)
BP Readings from Last 3 Encounters:  12/24/14 121/71  12/03/14 132/81  09/19/14 132/85    Lab Results  Component Value Date   NA 140 09/19/2014   K 3.9 09/19/2014   CREATININE 0.58 09/19/2014    Assessment: Blood pressure control: controlled Progress toward BP goal:  at goal Comments: Well controlled   Plan: Medications:  HCTZ 25 mg daily Educational resources provided: brochure (denies) Self management tools provided:   Other plans: Routine follow-up with PCP

## 2014-12-24 NOTE — Assessment & Plan Note (Signed)
His complaints of cough and nasal congestion likely due to allergic rhinitis. Patient states that has been having increased symptoms whenever he goes out in the grass. Encouraged him to use over-the-counter cough regimens. If he develops fevers, chills, or shortness of breath to come back for reevaluation.

## 2014-12-25 LAB — HELICOBACTER PYLORI  SPECIAL ANTIGEN: H. PYLORI ANTIGEN STOOL: NEGATIVE

## 2014-12-25 NOTE — Progress Notes (Signed)
INTERNAL MEDICINE TEACHING ATTENDING ADDENDUM - Mashonda Broski, MD: I reviewed and discussed at the time of visit with the resident Dr. Kazibwe, the patient's medical history, physical examination, diagnosis and results of pertinent tests and treatment and I agree with the patient's care as documented.  

## 2014-12-26 LAB — PROTEIN ELECTROPHORESIS, SERUM
ABNORMAL PROTEIN BAND1: 0.6 g/dL
Albumin ELP: 4.2 g/dL (ref 3.8–4.8)
Alpha-1-Globulin: 0.3 g/dL (ref 0.2–0.3)
Alpha-2-Globulin: 0.9 g/dL (ref 0.5–0.9)
Beta 2: 0.5 g/dL (ref 0.2–0.5)
Beta Globulin: 0.5 g/dL (ref 0.4–0.6)
Gamma Globulin: 1.5 g/dL (ref 0.8–1.7)
TOTAL PROTEIN, SERUM ELECTROPHOR: 7.8 g/dL (ref 6.1–8.1)

## 2014-12-26 LAB — PROTEIN ELECTROPHORESIS, URINE REFLEX: Total Protein, Urine: 4 mg/dL

## 2015-01-03 ENCOUNTER — Ambulatory Visit: Payer: Medicare Other | Admitting: Internal Medicine

## 2015-01-16 ENCOUNTER — Ambulatory Visit (INDEPENDENT_AMBULATORY_CARE_PROVIDER_SITE_OTHER): Payer: Medicare Other | Admitting: Pulmonary Disease

## 2015-01-16 ENCOUNTER — Encounter: Payer: Self-pay | Admitting: Pulmonary Disease

## 2015-01-16 VITALS — BP 115/83 | HR 74 | Temp 97.6°F | Ht 62.0 in | Wt 99.1 lb

## 2015-01-16 DIAGNOSIS — R634 Abnormal weight loss: Secondary | ICD-10-CM | POA: Diagnosis not present

## 2015-01-16 DIAGNOSIS — J310 Chronic rhinitis: Secondary | ICD-10-CM | POA: Diagnosis not present

## 2015-01-16 DIAGNOSIS — I10 Essential (primary) hypertension: Secondary | ICD-10-CM | POA: Diagnosis present

## 2015-01-16 MED ORDER — HYDROCHLOROTHIAZIDE 25 MG PO TABS: 25.0000 mg | ORAL_TABLET | Freq: Every day | ORAL | Status: DC

## 2015-01-16 MED ORDER — LORATADINE 10 MG PO TABS: 10.0000 mg | ORAL_TABLET | Freq: Every day | ORAL | Status: DC

## 2015-01-16 NOTE — Assessment & Plan Note (Signed)
Refilled rx for Claritin 10mg  daily

## 2015-01-16 NOTE — Assessment & Plan Note (Addendum)
His weight was 102lb 12.8oz 05/23/2014 and he is now 99lb 1.6oz. This is a 3.7lb loss over 8 months (<5%). His mother reports that he has been drinking alcohol heavily (beer, "as much as he can get his hands on"), and this may be the major contributing factor to his weight loss.  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 Unlikely to have myeloma as he has normal calcium, normal protein and albumin on CMP.  Workup to date: EGD 11/01/2014 with chronic active gastritis with H. Pylori and no evidence of dysplasia or malignancy. S/p treatment. H. Pylori stool antigen negative. CMP within normal limits (albumin 4.2, total protein 7.2) 09/19/2014. CBC with WBC 3.5 09/19/2014. TSH low at 0.244 but free T4 normal at 1.02 09/19/2014. ESR 1. PSA 0.96. HIV antibody nonreactive 05/23/2014. Colonoscopy 12/13/2013 with tubular adenoma with no high grade dysplasia or malignancy identified.  Plan:  -Abdominal U/S -At follow up obtain CBC, Urinalysis, Serum IFE, Free light chains, hepatitis C

## 2015-01-16 NOTE — Assessment & Plan Note (Addendum)
BP Readings from Last 3 Encounters:  01/16/15 115/83  12/24/14 121/71  12/03/14 132/81    Lab Results  Component Value Date   NA 140 09/19/2014   K 3.9 09/19/2014   CREATININE 0.58 09/19/2014    Assessment: Blood pressure control: controlled Progress toward BP goal:  at goal  Plan: Medications:  continue current medications including HCTZ 25mg  daily

## 2015-01-16 NOTE — Progress Notes (Signed)
   Subjective:    Patient ID: Gregory Hernandez, male    DOB: [birth year], 54 y.o.   MRN: [id number]  HPI [name] is a 54 year old man with history of HTN, TB, tobacco/alcohol abuse, unexplained weight loss presenting for follow up.  He has no complaints today. His mother reports that he has been drinking alcohol heavily. He drinks beer. He reports eating three meals a day and has started drinking Ensure.  Review of Systems Constitutional: no fevers/chills Eyes: no vision changes Ears, nose, mouth, throat, and face: no cough Respiratory: no shortness of breath Cardiovascular: no chest pain Gastrointestinal: no nausea/vomiting, no abdominal pain, no constipation, no diarrhea Genitourinary: no dysuria, no hematuria Integument: no rash Hematologic/lymphatic: no bleeding/bruising, no edema Musculoskeletal: no arthralgias, no myalgias Neurological: no paresthesias, no weakness  Past Medical History  Diagnosis Date  . Elevated BP   . TB (tuberculosis) 1996    Pulmonary TB (right upper lobe infiltrate) diagnosed May 1996; treated at [facility]  . Dermatitis     H/O Tinea dermatitis  . Rhinitis     Seasonal  . Facial pain 2007    Left eye and left facial pain January 2007; evaluated by ophthalmologist Dr. Ishmael Holter; CT of face and orbits 09/16/2005 unremarkable; etiology unclear; improved by 10/28/2005.  . Tobacco abuse   . Elevated transaminase level     Mild elevation.  . Fracture of left shoulder 2012  . Adenomatous colon polyp 12/14/2011  . Helicobacter pylori gastritis 11/16/2014    EGD done 11/01/2014 by Dr. Michail Sermon to work up weight loss showed gastritis; biopsy showed chronic active gastritis with Helicobacter pylori organisms.  Dr. Kathline Magic notes indicate plan to start treatment with Prevpac.     Current Outpatient Prescriptions on File Prior to Visit  Medication Sig Dispense Refill  . hydrochlorothiazide (HYDRODIURIL) 25 MG tablet Take 1 tablet (25  mg total) by mouth daily. 30 tablet 5  . loratadine (CLARITIN) 10 MG tablet Take 1 tablet (10 mg total) by mouth daily. (Patient not taking: Reported on 12/03/2014) 30 tablet 1   No current facility-administered medications on file prior to visit.    Today's Vitals   01/16/15 1056  BP: 115/83  Pulse: 74  Temp: 97.6 F (36.4 C)  TempSrc: Oral  Height: 5\' 2"  (1.575 m)  Weight: 99 lb 1.6 oz (44.951 kg)  SpO2: 100%  PainSc: 0-No pain   Objective:  Physical Exam  Constitutional: He is oriented to person, place, and time. No distress.  HENT:  Head: Normocephalic and atraumatic.  Eyes: Conjunctivae are normal.  Neck: Neck supple.  Cardiovascular: Normal rate and regular rhythm.   Pulmonary/Chest: Breath sounds normal. He has no wheezes.  Abdominal: Soft. He exhibits no distension.  Musculoskeletal: Normal range of motion. He exhibits no edema.  Neurological: He is alert and oriented to person, place, and time.  Skin: Skin is warm and dry.  Psychiatric: He has a normal mood and affect.   Assessment & Plan:  Please refer to problem based charting.

## 2015-01-17 ENCOUNTER — Other Ambulatory Visit: Payer: Self-pay | Admitting: Oncology

## 2015-01-17 DIAGNOSIS — D709 Neutropenia, unspecified: Secondary | ICD-10-CM

## 2015-01-17 DIAGNOSIS — R634 Abnormal weight loss: Secondary | ICD-10-CM

## 2015-01-17 NOTE — Progress Notes (Signed)
Medicine attending: Medical history, presenting problems, physical findings, and medications, reviewed with Dr Jennifer Krall and I concur with her evaluation and management plan. 

## 2015-01-28 ENCOUNTER — Ambulatory Visit (HOSPITAL_COMMUNITY)
Admission: RE | Admit: 2015-01-28 | Discharge: 2015-01-28 | Disposition: A | Discharge status: Home / Self Care | Payer: Medicare Other | Source: Ambulatory Visit | Attending: Oncology | Admitting: Oncology

## 2015-01-28 DIAGNOSIS — R634 Abnormal weight loss: Secondary | ICD-10-CM | POA: Insufficient documentation

## 2015-02-05 DIAGNOSIS — K2970 Gastritis, unspecified, without bleeding: Secondary | ICD-10-CM | POA: Diagnosis not present

## 2015-02-05 DIAGNOSIS — R634 Abnormal weight loss: Secondary | ICD-10-CM | POA: Diagnosis not present

## 2015-04-10 DIAGNOSIS — H01009 Unspecified blepharitis unspecified eye, unspecified eyelid: Secondary | ICD-10-CM | POA: Diagnosis not present

## 2015-04-10 DIAGNOSIS — H2513 Age-related nuclear cataract, bilateral: Secondary | ICD-10-CM | POA: Diagnosis not present

## 2015-06-17 ENCOUNTER — Ambulatory Visit (INDEPENDENT_AMBULATORY_CARE_PROVIDER_SITE_OTHER): Payer: Medicare Other | Admitting: Student in an Organized Health Care Education/Training Program

## 2015-06-17 ENCOUNTER — Encounter: Payer: Self-pay | Admitting: Student in an Organized Health Care Education/Training Program

## 2015-06-17 VITALS — BP 116/70 | HR 85 | Temp 97.5°F | Wt 98.2 lb

## 2015-06-17 DIAGNOSIS — R634 Abnormal weight loss: Secondary | ICD-10-CM

## 2015-06-17 DIAGNOSIS — E785 Hyperlipidemia, unspecified: Secondary | ICD-10-CM

## 2015-06-17 DIAGNOSIS — I10 Essential (primary) hypertension: Secondary | ICD-10-CM | POA: Diagnosis not present

## 2015-06-17 DIAGNOSIS — D72819 Decreased white blood cell count, unspecified: Secondary | ICD-10-CM

## 2015-06-17 DIAGNOSIS — D709 Neutropenia, unspecified: Secondary | ICD-10-CM

## 2015-06-17 DIAGNOSIS — F1010 Alcohol abuse, uncomplicated: Secondary | ICD-10-CM | POA: Insufficient documentation

## 2015-06-17 MED ORDER — PRAVASTATIN SODIUM 20 MG PO TABS: 20.0000 mg | ORAL_TABLET | Freq: Every evening | ORAL | Status: DC

## 2015-06-17 NOTE — Assessment & Plan Note (Signed)
Based on 2014 lipid levels, ASCVD 10 year risk of over 8%. We discussed goals and risks of statin therapy for primary prevention of CVD events. Patient would like to start therapy so we will try pravastatin 20mg  daily and follow up any side effects at our next visit. I doubt he would benefit from aspirin therapy at this point.

## 2015-06-17 NOTE — Progress Notes (Signed)
   See Encounters tab for problem-based medical decision making  __________________________________________________________  HPI:  Patient is a 54 year old man comes in today for follow-up of unintentional weight loss. Over the last 2 years he has lost about 10 pounds without clear explanation. He reports feeling well since he was last seen here. Eating and drinking well, no nausea or vomiting. Denies diarrhea. No fevers or chills recently. He has not started any supplemental nutrition program. Still has a good exertional capacity, says that he walks to the store every day. He does not work, stays at home, lives by himself. Continues to drink alcohol nearly every day. Says that he has usually around one 40 ounce beer on the days he does drink, usually 4-5 large beers per week. Reports good compliance with his antihypertensive medication. No symptoms of presyncope.  __________________________________________________________  Problem List: Patient Active Problem List   Diagnosis Date Noted  . Hyperlipidemia 06/17/2015  . Alcohol over use 06/17/2015  . Loss of weight 05/23/2014  . Neutropenia ([place]) 04/21/2013  . Preventative health care 08/19/2011  . Hypertension 09/21/2008    Medications: Reconciled today in Epic __________________________________________________________  Physical Exam:  Vital Signs: Filed Vitals:   06/17/15 0921  BP: 116/70  Pulse: 85  Temp: 97.5 F (36.4 C)  TempSrc: Oral  Weight: 98 lb 3.2 oz (44.543 kg)  SpO2: 100%    Gen: Well appearing, thin man, NAD ENT: OP clear without erythema or exudate, multiple missing teeth Neck: No cervical LAD, No thyromegaly or nodules, No JVD. CV: RRR, no murmurs Pulm: Normal effort, CTA throughout, no wheezing Abd: Soft, NT, ND, normal BS.  Ext: Warm, no edema, normal joints Skin: No atypical appearing moles. No rashes

## 2015-06-17 NOTE — Assessment & Plan Note (Signed)
10 pound weightloss over the last 2 years, BMI now low-normal at 18. 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 Plan for immunofixation and serum free light chains today to clarify. Weight is now stable at 98lbs since last visit. Follow up in 3 months for another weight check.

## 2015-06-17 NOTE — Assessment & Plan Note (Signed)
Intermittent leukopenia over the last several years, which is mostly neutropenia. No medications to cause this. HIV recently negative. Unclear explanation. Plan to repeat CBC with diff today for monitoring.

## 2015-06-17 NOTE — Patient Instructions (Signed)
1. Your weight is unchanged from last visit, which is good news.   2. We are checking some more blood work today, I will send you a letter with the results by the end of the week.   3. We talked about your moderately high cholesterol, and we are going to start a new cholesterol medicine called Pravastatin. Take one tablet every day with your blood pressure medicine. Call me if you have any trouble or questions.

## 2015-06-17 NOTE — Assessment & Plan Note (Signed)
Blood pressure is under great control. Renal function normal. Good compliance with no side effects. Continuing HCTZ 25mg  daily.

## 2015-06-17 NOTE — Assessment & Plan Note (Addendum)
Drinks 4-5 forty ounce beers weekly. No signs of alcohol use disorder. Best characterized as at risk drinking currently. No signs of cirrhosis, abdominal ultrasound 2016 with normal liver echotexture. Plan to check HCV antibody today.

## 2015-06-18 LAB — CBC WITH DIFFERENTIAL/PLATELET
Basophils Absolute: 0.1 10*3/uL (ref 0.0–0.2)
Basos: 2 %
EOS (ABSOLUTE): 0.2 10*3/uL (ref 0.0–0.4)
EOS: 5 %
Hematocrit: 44.7 % (ref 37.5–51.0)
Hemoglobin: 14.3 g/dL (ref 12.6–17.7)
IMMATURE GRANS (ABS): 0 10*3/uL (ref 0.0–0.1)
Immature Granulocytes: 0 %
LYMPHS: 26 %
Lymphocytes Absolute: 1 10*3/uL (ref 0.7–3.1)
MCH: 28.3 pg (ref 26.6–33.0)
MCHC: 32 g/dL (ref 31.5–35.7)
MCV: 88 fL (ref 79–97)
MONOCYTES: 8 %
MONOS ABS: 0.3 10*3/uL (ref 0.1–0.9)
NEUTROS PCT: 59 %
Neutrophils Absolute: 2.2 10*3/uL (ref 1.4–7.0)
PLATELETS: 289 10*3/uL (ref 150–379)
RBC: 5.06 x10E6/uL (ref 4.14–5.80)
RDW: 14.2 % (ref 12.3–15.4)
WBC: 3.7 10*3/uL (ref 3.4–10.8)

## 2015-06-18 LAB — IMMUNOFIXATION ELECTROPHORESIS
IGG (IMMUNOGLOBIN G), SERUM: 1421 mg/dL (ref 700–1600)
IgA/Immunoglobulin A, Serum: 338 mg/dL (ref 90–386)
IgM (Immunoglobulin M), Srm: 73 mg/dL (ref 20–172)
Total Protein: 8 g/dL (ref 6.0–8.5)

## 2015-06-18 LAB — KAPPA/LAMBDA LIGHT CHAINS
Ig Kappa Free Light Chain: 18.35 mg/L (ref 3.30–19.40)
Ig Lambda Free Light Chain: 11.99 mg/L (ref 5.71–26.30)
KAPPA/LAMBDA FLC RATIO: 1.53 (ref 0.26–1.65)

## 2015-06-18 LAB — HEPATITIS C ANTIBODY: Hep C Virus Ab: <0.1 s/co ratio (ref 0.0–0.9)

## 2015-06-20 ENCOUNTER — Encounter: Payer: Self-pay | Admitting: Student in an Organized Health Care Education/Training Program

## 2015-07-08 ENCOUNTER — Other Ambulatory Visit: Payer: Self-pay | Admitting: Internal Medicine

## 2015-10-04 ENCOUNTER — Encounter: Payer: Self-pay | Admitting: Internal Medicine

## 2015-10-04 ENCOUNTER — Ambulatory Visit (INDEPENDENT_AMBULATORY_CARE_PROVIDER_SITE_OTHER): Payer: Medicare Other | Admitting: Internal Medicine

## 2015-10-04 VITALS — BP 132/87 | HR 75 | Temp 97.6°F | Ht 62.0 in | Wt 97.9 lb

## 2015-10-04 DIAGNOSIS — Z7984 Long term (current) use of oral hypoglycemic drugs: Secondary | ICD-10-CM | POA: Diagnosis not present

## 2015-10-04 DIAGNOSIS — E038 Other specified hypothyroidism: Secondary | ICD-10-CM

## 2015-10-04 DIAGNOSIS — E0580 Other thyrotoxicosis without thyrotoxic crisis or storm: Secondary | ICD-10-CM

## 2015-10-04 DIAGNOSIS — I10 Essential (primary) hypertension: Secondary | ICD-10-CM

## 2015-10-04 DIAGNOSIS — R634 Abnormal weight loss: Secondary | ICD-10-CM

## 2015-10-04 DIAGNOSIS — E0590 Thyrotoxicosis, unspecified without thyrotoxic crisis or storm: Secondary | ICD-10-CM

## 2015-10-04 DIAGNOSIS — Z87891 Personal history of nicotine dependence: Secondary | ICD-10-CM | POA: Diagnosis not present

## 2015-10-04 DIAGNOSIS — E039 Hypothyroidism, unspecified: Secondary | ICD-10-CM

## 2015-10-04 NOTE — Patient Instructions (Signed)
General Instructions:  I am rechecking your thyroid labs, if they continue to be abnormal we may consider having you come back sooner to start a thyroid medication  Thank you for bringing your medicines today. This helps [place] keep you safe from mistakes.   Progress Toward Treatment Goals:  Treatment Goal 01/16/2015  Blood pressure at goal    Self Care Goals & Plans:  Self Care Goal 10/04/2015  Manage my medications take my medicines as prescribed; bring my medications to every visit; refill my medications on time  Monitor my health bring my glucose meter and log to each visit; keep track of my blood pressure; bring my blood pressure log to each visit  Eat healthy foods eat more vegetables; eat foods that are low in salt; eat baked foods instead of fried foods  Be physically active take a walk every day  Meeting treatment goals -    No flowsheet data found.   Care Management & Community Referrals:  Referral 12/24/2014  Referrals made for care management support none needed  Referrals made to community resources -

## 2015-10-04 NOTE — Assessment & Plan Note (Addendum)
A: Essential Hypertension at goal  P: - Continue HCTZ 25mg  daily - Repeat BMP

## 2015-10-04 NOTE — Assessment & Plan Note (Signed)
A: Weight loss  P: Patient's weight is roughly stable but he cotinues to decline very slightly.  He has been worked up for [employer].  I will repeat Thyroid labs to evaluate his subclinical hyperthyroidism and consider treatment if it become overt hyperthyroidism, or we could consider possible treatment given his ongoing weight loss.

## 2015-10-04 NOTE — Progress Notes (Signed)
[facility] Subjective:   Patient ID: Gregory Hernandez male   DOB: 02-28-1961 55 y.o.   MRN: [id number]  HPI: [name] is a 55 y.o. male with a PMH detailed below who presents for 3 month follow up for weight loss and hypertension.  Wt Readings from Last 5 Encounters:  10/04/15 97 lb 14.4 oz (44.407 kg)  06/17/15 98 lb 3.2 oz (44.543 kg)  01/16/15 99 lb 1.6 oz (44.951 kg)  12/24/14 99 lb 9.6 oz (45.178 kg)  12/03/14 101 lb 11.2 oz (46.131 kg)   He has had weight loss ongoing since at least 2010.  He has had a malignancy work up which was negative.  More recently his weight appears to have stabilized but he still has a very slight weight loss. He reports that he eats 3 meals a day. He denies any loss of appetite.  He does have occasional dental pain and see the dentist.  He does have a history of ETOH abuse. He reports he drinks only a beer rarely now. He has a history os subclinical hypothyroidism, he denies any heat or cold intolerances, changes in nails or hair, denies fatigue or malaise.  Past Medical History  Diagnosis Date  . Elevated BP   . TB (tuberculosis) 1996    Pulmonary TB (right upper lobe infiltrate) diagnosed May 1996; treated at [facility]  . Dermatitis     H/O Tinea dermatitis  . Rhinitis     Seasonal  . Facial pain 2007    Left eye and left facial pain January 2007; evaluated by ophthalmologist Dr. Ishmael Holter; CT of face and orbits 09/16/2005 unremarkable; etiology unclear; improved by 10/28/2005.  . Tobacco abuse   . Elevated transaminase level     Mild elevation.  . Fracture of left shoulder 2012  . Adenomatous colon polyp 12/14/2011  . Helicobacter pylori gastritis 11/16/2014    EGD done 11/01/2014 by Dr. Michail Sermon to work up weight loss showed gastritis; biopsy showed chronic active gastritis with Helicobacter pylori organisms.  Dr. Kathline Magic notes indicate plan to start treatment with Prevpac.    Current Outpatient  Prescriptions  Medication Sig Dispense Refill  . hydrochlorothiazide (HYDRODIURIL) 25 MG tablet Take 1 tablet (25 mg total) by mouth daily. 30 tablet 5  . hydrochlorothiazide (HYDRODIURIL) 25 MG tablet TAKE 1 TABLET (25 MG TOTAL) BY MOUTH DAILY. 30 tablet 5  . pravastatin (PRAVACHOL) 20 MG tablet Take 1 tablet (20 mg total) by mouth every evening. 30 tablet 11   No current facility-administered medications for this visit.   Family History  Problem Relation Age of Onset  . Hypertension Mother   . Diabetes Mother   . Cancer Cousin   . Heart attack Mother   . Peptic Ulcer Mother   . Arthritis Mother    Social History   Social History  . Marital Status: Single    Spouse Name: N/A  . Number of Children: N/A  . Years of Education: N/A   Social History Main Topics  . Smoking status: Former Smoker -- 2.00 packs/day for 7 years    Types: Cigarettes    Quit date: 08/18/2009  . Smokeless tobacco: Never Used  . Alcohol Use: 0.0 oz/week    0 Standard drinks or equivalent per week     Comment: About 2-3 beers per day but not every day per patient  . Drug Use: No  . Sexual Activity: Not Asked   Other Topics Concern  .  None   Social History Narrative   Review of Systems: Review of Systems  Constitutional: Negative for fever, chills and malaise/fatigue.  Respiratory: Negative for cough and shortness of breath.   Cardiovascular: Negative for chest pain.  Gastrointestinal: Negative for abdominal pain and diarrhea.  Genitourinary: Negative for dysuria.  Neurological: Negative for dizziness.  Psychiatric/Behavioral: Negative for depression and substance abuse.     Objective:  Physical Exam: Filed Vitals:   10/04/15 0920  BP: 132/87  Pulse: 75  Temp: 97.6 F (36.4 C)  TempSrc: Oral  Height: 5\' 2"  (1.575 m)  Weight: 97 lb 14.4 oz (44.407 kg)  SpO2: 100%  Physical Exam  Constitutional: He is well-developed, well-nourished, and in no distress.  HENT:  Head: Normocephalic and  atraumatic.  Neck: Thyromegaly (mildly enlarged, no appreciated nodules, non tender) present.  Cardiovascular: Normal rate and regular rhythm.   Pulmonary/Chest: Effort normal and breath sounds normal.  Musculoskeletal: He exhibits no edema.  Nursing note and vitals reviewed.   Assessment & Plan:  Case discussed with Dr. Daryll Drown  Subclinical hyperthyroidism A: Subclinical hyperthyroidism  P: -Repeat TSH, T4 and T3 (would continue to check every 69months to 1 year) - Overall appears asymptomatic although he does have this chronic weight loss that could be attributed to hyperthyroidism. - Will defer to PCP in discussing possible treatment pending labs  Hypertension A: Essential Hypertension at goal  P: - Continue HCTZ 25mg  daily - Repeat BMP  Loss of weight A: Weight loss  P: Patient's weight is roughly stable but he cotinues to decline very slightly.  He has been worked up for [employer].  I will repeat Thyroid labs to evaluate his subclinical hyperthyroidism and consider treatment if it become overt hyperthyroidism, or we could consider possible treatment given his ongoing weight loss.    Medications Ordered No orders of the defined types were placed in this encounter.   Other Orders Orders Placed This Encounter  Procedures  . BMP8+Anion Gap  . TSH  . T4, Free  . T3, free   Follow Up: Return in about 3 months (around 01/02/2016), or 3 months with Dr Evette Doffing.

## 2015-10-04 NOTE — Assessment & Plan Note (Addendum)
A: Subclinical hyperthyroidism  P: -Repeat TSH, T4 and T3 (would continue to check every 96months to 1 year) - Overall appears asymptomatic although he does have this chronic weight loss that could be attributed to hyperthyroidism. - Will defer to PCP in discussing possible treatment pending labs

## 2015-10-05 LAB — BMP8+ANION GAP
ANION GAP: 18 mmol/L (ref 10.0–18.0)
BUN/Creatinine Ratio: 14 (ref 9–20)
BUN: 9 mg/dL (ref 6–24)
CO2: 24 mmol/L (ref 18–29)
Calcium: 10 mg/dL (ref 8.7–10.2)
Chloride: 104 mmol/L (ref 96–106)
Creatinine, Ser: 0.63 mg/dL — ABNORMAL LOW (ref 0.76–1.27)
GFR calc Af Amer: 127 mL/min/{1.73_m2} (ref >59)
GFR calc non Af Amer: 110 mL/min/{1.73_m2} (ref >59)
Glucose: 95 mg/dL (ref 65–99)
Potassium: 4.1 mmol/L (ref 3.5–5.2)
SODIUM: 146 mmol/L — AB (ref 134–144)

## 2015-10-05 LAB — TSH: TSH: 0.106 u[IU]/mL — ABNORMAL LOW (ref 0.450–4.500)

## 2015-10-05 LAB — T3, FREE: T3, Free: 3.5 pg/mL (ref 2.0–4.4)

## 2015-10-05 LAB — T4, FREE: Free T4: 1.06 ng/dL (ref 0.82–1.77)

## 2015-10-09 NOTE — Progress Notes (Signed)
Internal Medicine Clinic Attending  Case discussed with Dr. Hoffman soon after the resident saw the patient.  We reviewed the resident's history and exam and pertinent patient test results.  I agree with the assessment, diagnosis, and plan of care documented in the resident's note. 

## 2016-01-14 IMAGING — US US ABDOMEN COMPLETE
1 series · 14 of 25 positions shown · non-contrast
Comparison: None.

CLINICAL DATA: Unexplained weight loss.  Heavy alcohol use.

EXAM:
ULTRASOUND ABDOMEN COMPLETE

[Series 1: us abdomen complete · 0.17mm/px · 14 of 72 slices shown]
[im 1/72]
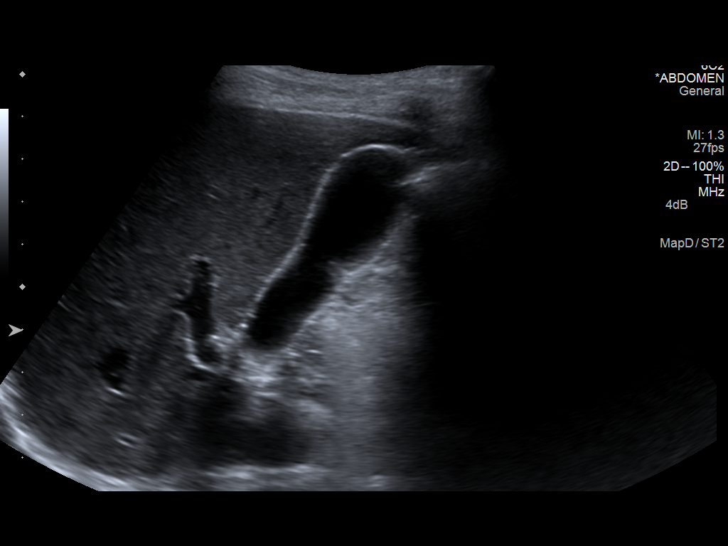
[im 6/72]
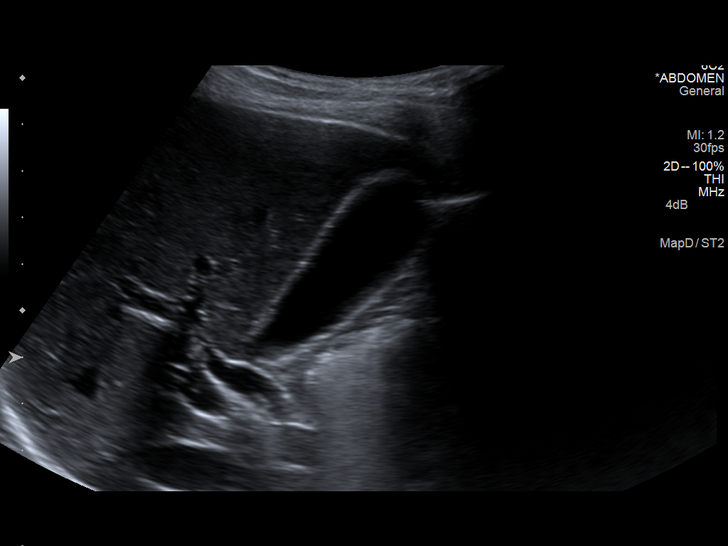
[im 12/72]
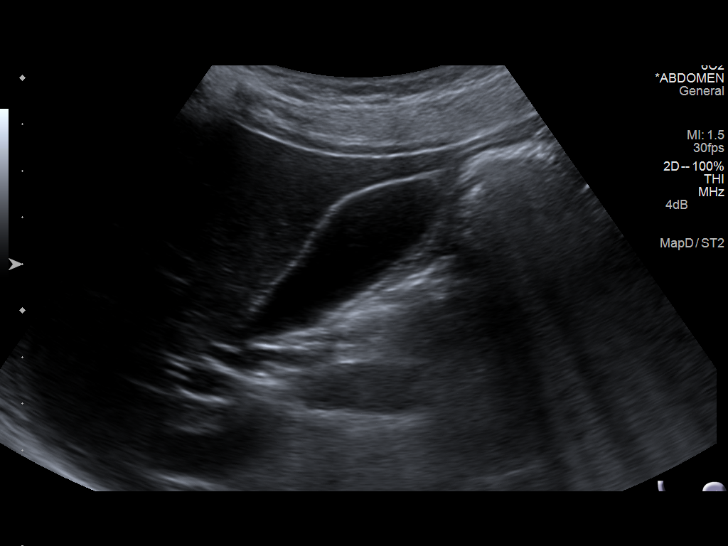
[im 18/72]
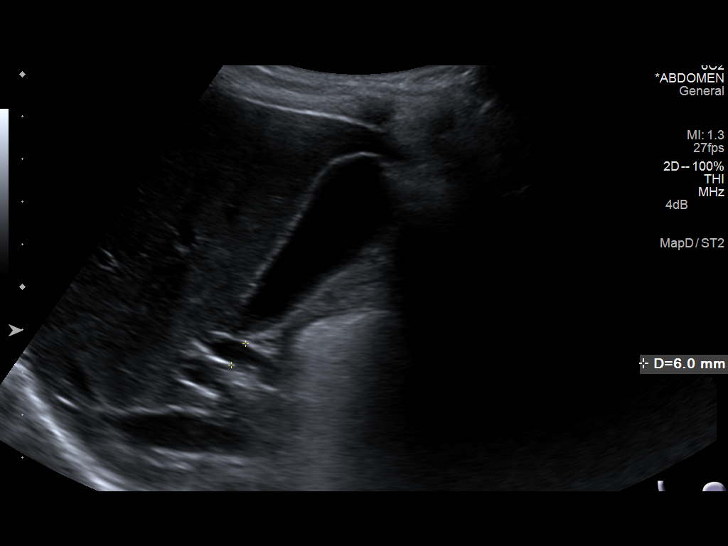
[im 24/72]
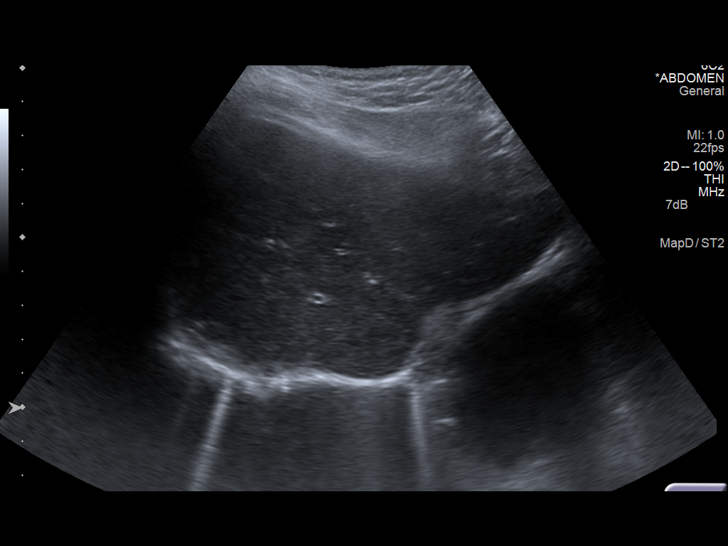
[im 27/72]
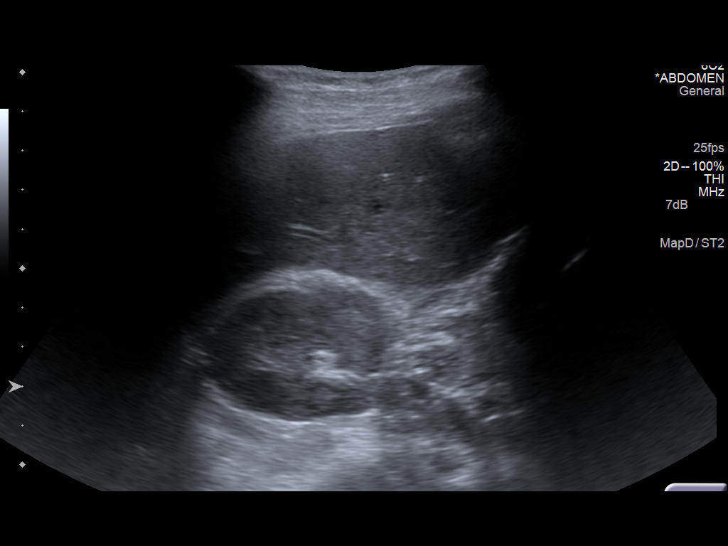
[im 33/72]
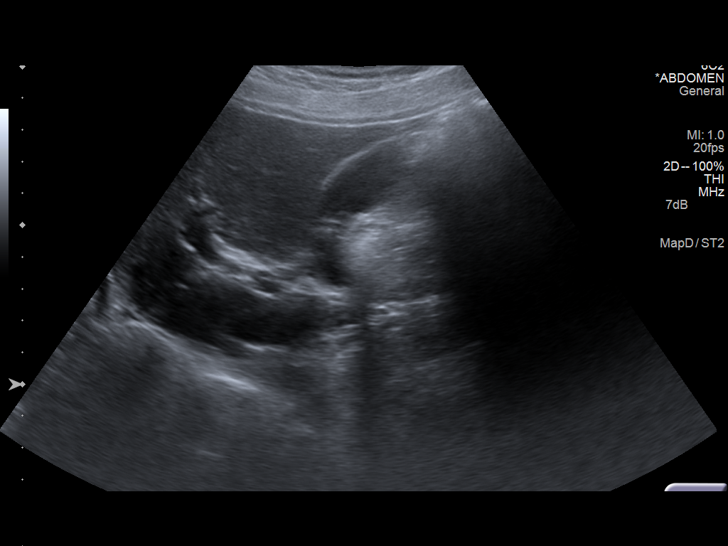
[im 39/72]
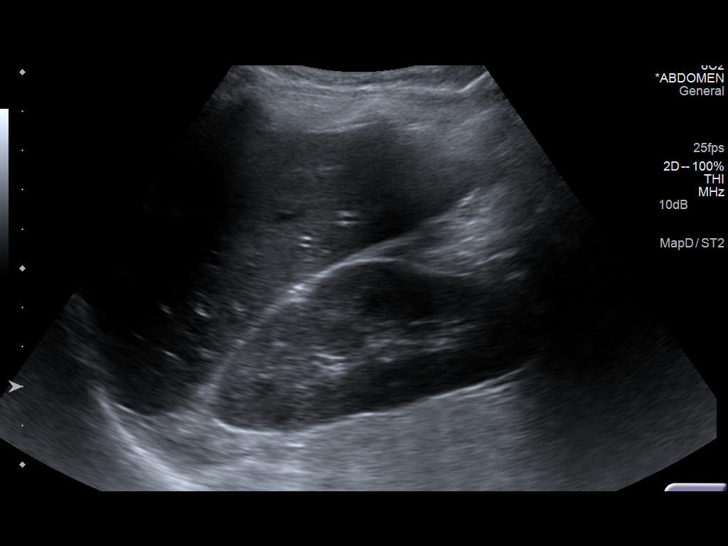
[im 45/72]
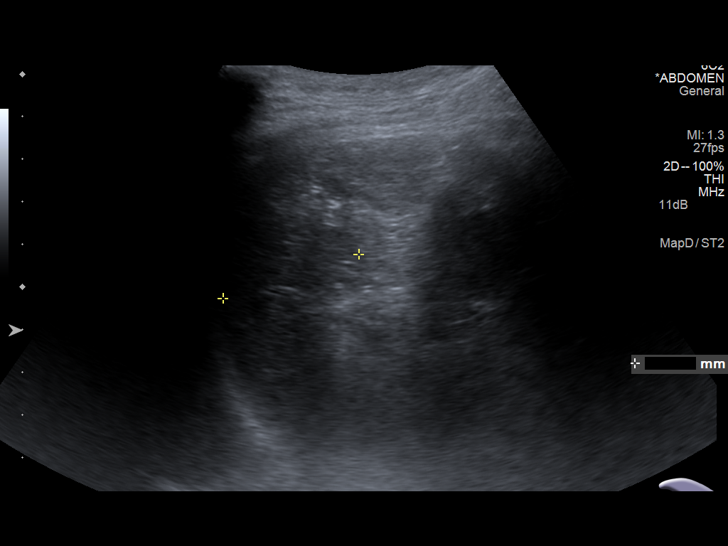
[im 48/72]
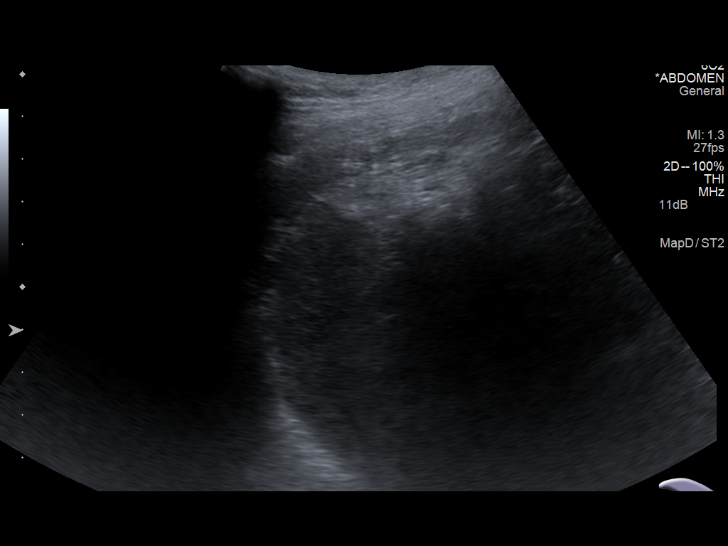
[im 54/72]
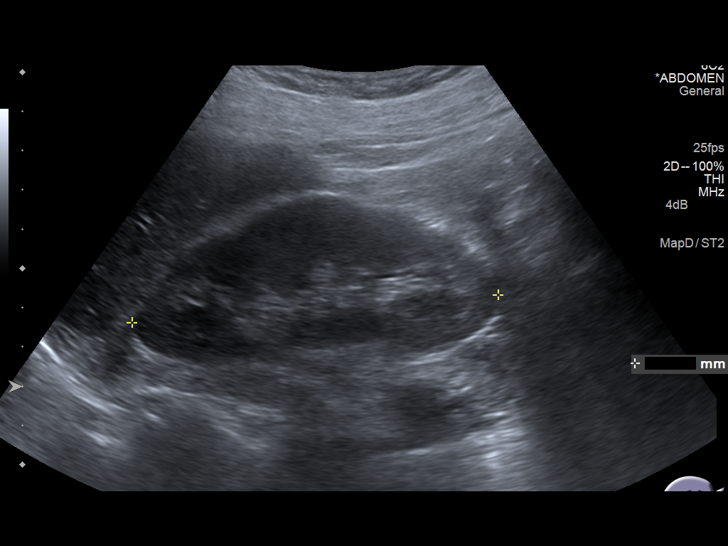
[im 60/72]
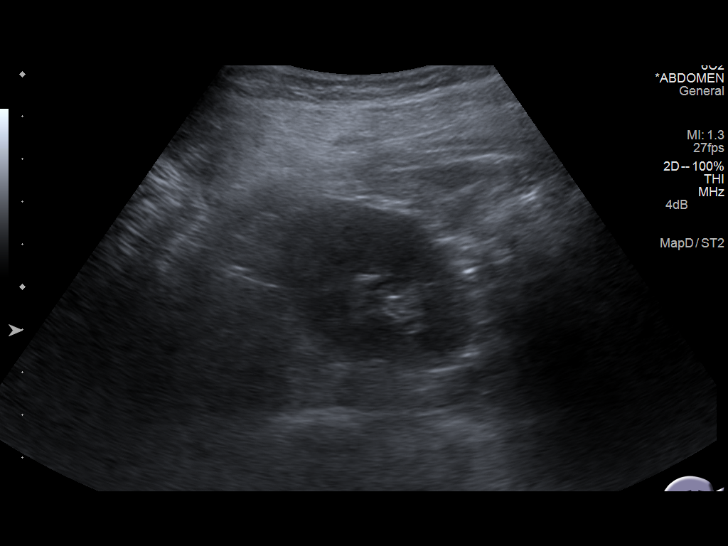
[im 66/72]
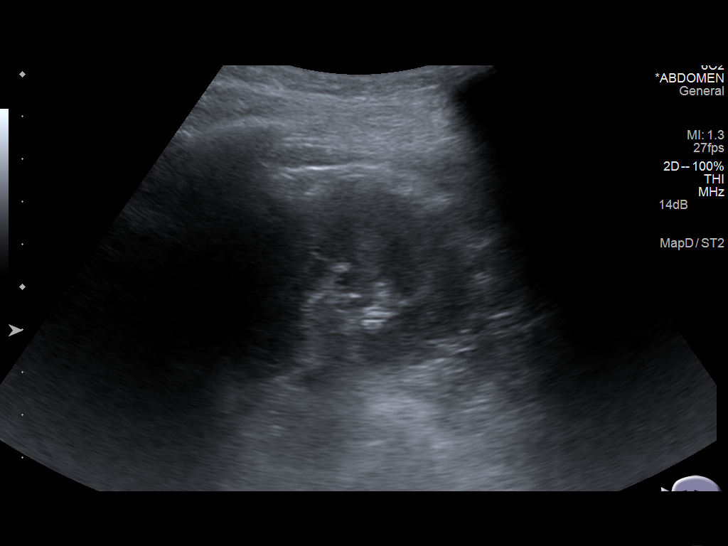
[im 72/72]
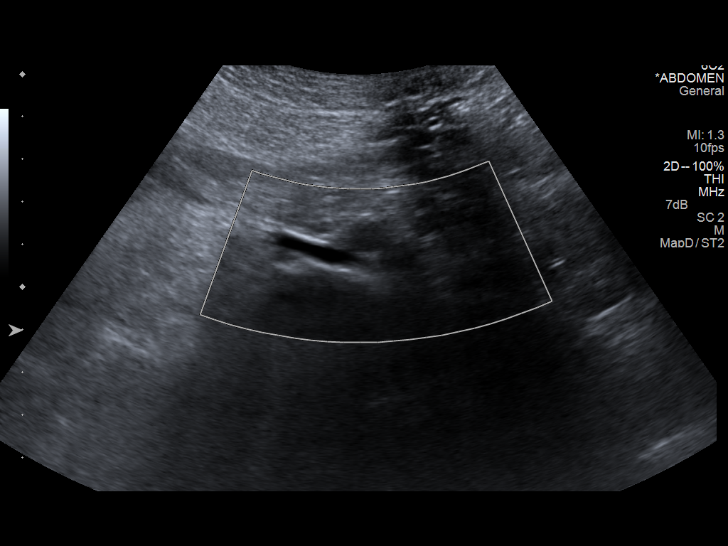

[14 of 25 positions shown; findings below may reference images not displayed]

FINDINGS: Gallbladder: Gallbladder has a normal appearance. Gallbladder wall
is 1.8 mm, within normal limits. No stones or pericholecystic fluid.
No sonographic Murphy's sign.

Common bile duct: Diameter: 6.0 mm

Liver: No focal lesion identified. Within normal limits in
parenchymal echogenicity.

IVC: No abnormality visualized.

Pancreas: Visualized portion unremarkable.

Spleen: Size and appearance within normal limits.

Right Kidney: Length: 9.4 cm. Echogenicity within normal limits. No
mass or hydronephrosis visualized.

Left Kidney: Length: 9.2 cm. Echogenicity within normal limits. No
mass or hydronephrosis visualized.

Abdominal aorta: No aneurysm visualized.

Other findings: None.
IMPRESSION: Normal evaluation of the abdomen.

## 2016-02-17 ENCOUNTER — Ambulatory Visit (INDEPENDENT_AMBULATORY_CARE_PROVIDER_SITE_OTHER): Payer: Medicare Other | Admitting: Student in an Organized Health Care Education/Training Program

## 2016-02-17 VITALS — BP 140/88 | HR 82 | Temp 98.1°F | Ht 62.0 in | Wt 98.1 lb

## 2016-02-17 DIAGNOSIS — F1010 Alcohol abuse, uncomplicated: Secondary | ICD-10-CM

## 2016-02-17 DIAGNOSIS — E0590 Thyrotoxicosis, unspecified without thyrotoxic crisis or storm: Secondary | ICD-10-CM

## 2016-02-17 DIAGNOSIS — E785 Hyperlipidemia, unspecified: Secondary | ICD-10-CM

## 2016-02-17 DIAGNOSIS — I10 Essential (primary) hypertension: Secondary | ICD-10-CM

## 2016-02-17 DIAGNOSIS — E0580 Other thyrotoxicosis without thyrotoxic crisis or storm: Secondary | ICD-10-CM | POA: Diagnosis present

## 2016-02-17 DIAGNOSIS — Z7289 Other problems related to lifestyle: Secondary | ICD-10-CM

## 2016-02-17 MED ORDER — HYDROCHLOROTHIAZIDE 25 MG PO TABS: 25.0000 mg | ORAL_TABLET | Freq: Every day | ORAL | Status: DC

## 2016-02-17 MED ORDER — PRAVASTATIN SODIUM 20 MG PO TABS: 20.0000 mg | ORAL_TABLET | Freq: Every evening | ORAL | Status: DC

## 2016-02-17 NOTE — Patient Instructions (Signed)
1. Your blood pressure looks great. Keep taking your medicine as you are.   2. Your thyroid may be a little overactive. We are going to run two tests. One is a blood test we will do today. The other is a test with the radiology department at [facility] that will take pictures of your thyroid gland. This will help [place] decide if we need to start any new medicines.

## 2016-02-17 NOTE — Assessment & Plan Note (Signed)
Decreased TSH and normal T4/T3 have been demonstrated since at least 2015 and have been stable on subsequent checks annually. His weight loss over this time  course could potentially be related. On exam today he has a diffusely enlarged thyroid. I anticipate his subclinical hyperthyroidism is due to a diffuse goiter. Plan is to rule out Graves disease by checking thyroid-stimulating antibodies today and obtain a radioiodine uptake scan soon. If this is Graves, he will likely benefit from endocrine consult regarding treatment or ablation. If it is just a goiter, I think we can hold treatment and monitor thyroid labs annually.

## 2016-02-17 NOTE — Assessment & Plan Note (Signed)
Blood pressure is well-controlled today. Tolerating antihypertensives well with no adverse effects. Plan to continue hydrochlorothiazide 25 mg daily. Last labs in January were appropriate.

## 2016-02-17 NOTE — Progress Notes (Signed)
   See Encounters tab for problem-based medical decision making  __________________________________________________________  HPI:   56 year old man comes today for follow-up of weight loss. Over the last 5 years he's lost approximately 15 pounds in unintentional weight loss. His weight has been stable around 98 pounds for the last 1 year. A workup so far has not revealed an underlying malignancy. I think there remains two possible explanations: it could be poor nutrition related to alcohol use disorder, could also be related to subclinical hyperthyroidism which we have been following since 2015. Patient denies symptoms of heart palpitations, heat intolerance, tremor, changes in his vision, difficulty swallowing. Reports that he is eating and drinking well. Lives at home by himself. He is close with his mother and he has a girlfriend who both are worried about his alcohol use. He says that he has cut way back on his drinking, last drink was about a month ago. Michela Pitcher that he is having a lot of stress right now and noticed that his girlfriend was worried about how much he was drinking which was his motivation to stop. He says that he can continue to abstain from alcohol on his own.   __________________________________________________________  Problem List: Patient Active Problem List   Diagnosis Date Noted  . Subclinical hyperthyroidism 10/04/2015  . Hyperlipidemia 06/17/2015  . Alcohol over use 06/17/2015  . Loss of weight 05/23/2014  . Neutropenia ([place]) 04/21/2013  . Preventative health care 08/19/2011  . Hypertension 09/21/2008    Medications: Reconciled today in Epic __________________________________________________________  Physical Exam:  Vital Signs: Filed Vitals:   02/17/16 0820  BP: 140/88  Pulse: 82  Temp: 98.1 F (36.7 C)  TempSrc: Oral  Height: 5\' 2"  (1.575 m)  Weight: 98 lb 1.6 oz (44.498 kg)  SpO2: 99%    Gen: Well appearing, NAD ENT: OP clear without erythema or  exudate, poor dentition. Neck: Diffusely enlarged and soft thyroid gland, 1 cm thyroid nodule on the left side which is soft and freely movable, on auscultation I hear the carotid upstroke but no diffuse bruit over the thyroid. CV: RRR, no murmurs Pulm: Normal effort, CTA throughout, no wheezing Ext: Warm, no edema, normal joints

## 2016-02-17 NOTE — Assessment & Plan Note (Signed)
Since her last visit he started pravastatin for primary prevention of  cardiac vascular disease. is doing well on this medication with no adverse side effects. Plan to continue pravastatin 20 mg daily. We decided against aspirin daily for primary prevention.

## 2016-02-17 NOTE — Assessment & Plan Note (Signed)
Patient reports abstinence from alcohol for about one month. I think he is best classified as at risk drinking behavior, I think this is going to wax and wane especially related to social stressors. He's not interested in medications to help him obtain abstinence at this time. We will continue to monitor.

## 2016-02-21 ENCOUNTER — Ambulatory Visit (INDEPENDENT_AMBULATORY_CARE_PROVIDER_SITE_OTHER): Payer: Medicare Other | Admitting: Internal Medicine

## 2016-02-21 ENCOUNTER — Encounter: Payer: Self-pay | Admitting: Internal Medicine

## 2016-02-21 VITALS — BP 119/85 | HR 97 | Temp 97.7°F | Ht 64.0 in | Wt 97.7 lb

## 2016-02-21 DIAGNOSIS — E0590 Thyrotoxicosis, unspecified without thyrotoxic crisis or storm: Secondary | ICD-10-CM

## 2016-02-21 NOTE — Progress Notes (Signed)
Patient ID: Gregory Hernandez, male   DOB: 07-Jan-1961, 55 y.o.   MRN: [id number]  This visit scheduled in error.  No charge.

## 2016-02-21 NOTE — Progress Notes (Signed)
Medicine attending: Medical history, reviewed with resident physician Dr Laureen Abrahams on the day of the patient visit  Gregory Hernandez. Patient just seen here Monday. This was an erroneous encounter today. He will not be charged.

## 2016-02-24 ENCOUNTER — Encounter (HOSPITAL_COMMUNITY): Payer: Self-pay

## 2016-02-24 ENCOUNTER — Telehealth: Payer: Self-pay

## 2016-02-24 ENCOUNTER — Emergency Department (HOSPITAL_COMMUNITY): Payer: Medicare Other

## 2016-02-24 ENCOUNTER — Emergency Department (HOSPITAL_COMMUNITY)
Admission: EM | Admit: 2016-02-24 | Discharge: 2016-02-24 | Disposition: A | Discharge status: Home / Self Care | Payer: Medicare Other | Attending: Emergency Medicine | Admitting: Emergency Medicine

## 2016-02-24 DIAGNOSIS — R05 Cough: Secondary | ICD-10-CM | POA: Insufficient documentation

## 2016-02-24 DIAGNOSIS — R059 Cough, unspecified: Secondary | ICD-10-CM

## 2016-02-24 DIAGNOSIS — R0981 Nasal congestion: Secondary | ICD-10-CM | POA: Diagnosis not present

## 2016-02-24 DIAGNOSIS — Z87891 Personal history of nicotine dependence: Secondary | ICD-10-CM | POA: Diagnosis not present

## 2016-02-24 MED ORDER — GUAIFENESIN ER 600 MG PO TB12: 600.0000 mg | ORAL_TABLET | Freq: Two times a day (BID) | ORAL | Status: DC

## 2016-02-24 MED ORDER — LORATADINE 10 MG PO TABS: 10.0000 mg | ORAL_TABLET | Freq: Every day | ORAL | Status: DC

## 2016-02-24 NOTE — Discharge Instructions (Signed)
You have been seen today for cough and congestion. Your imaging showed no abnormalities. Your symptoms are consistent with a viral illness or allergies due to the recent weather changes. Viruses do not require antibiotics. Treatment is symptomatic care. Drink plenty of fluids and get plenty of rest. Ibuprofen or Tylenol for pain or fever. Take the Claritin daily, no matter how you feel. Plain Mucinex may help relieve congestion. Follow up with PCP as needed should symptoms continue. Return to ED should symptoms worsen.  You previous lab tests show you may have hyperthyroidism. Follow up with endocrinology as soon as possible. Call the number provided to set up an appointment.

## 2016-02-24 NOTE — ED Notes (Signed)
Patient complains of cough and congestion x 2 days, no distress

## 2016-02-24 NOTE — ED Provider Notes (Signed)
CSN: [id number]     Arrival date & time 02/24/16  1133 History  By signing my name below, I, Gregory Hernandez, attest that this documentation has been prepared under the direction and in the presence of Gregory Hopping, PA-C Electronically Signed: Ladene Hernandez, ED Scribe 02/24/2016 at 1:26 PM.    Chief Complaint  Patient presents with  . Nasal Congestion  . Cough   The history is provided by the patient. No language interpreter was used.   HPI Comments: Gregory Hernandez is a 55 y.o. male, with a h/o TB, who presents to the Emergency Department complaining of persistent dry cough for the past 2 days. Pt reports an associated symptom of nasal congestion that is worse at night. No treatments tried PTA. He denies fever/chills, nausea/vomiting, chest pain, shortness of breath, or any other complaints.   Past Medical History  Diagnosis Date  . Elevated BP   . TB (tuberculosis) 1996    Pulmonary TB (right upper lobe infiltrate) diagnosed May 1996; treated at [facility]  . Dermatitis     H/O Tinea dermatitis  . Rhinitis     Seasonal  . Facial pain 2007    Left eye and left facial pain January 2007; evaluated by ophthalmologist Gregory Hernandez; CT of face and orbits 09/16/2005 unremarkable; etiology unclear; improved by 10/28/2005.  . Tobacco abuse   . Elevated transaminase level     Mild elevation.  . Fracture of left shoulder 2012  . Adenomatous colon polyp 12/14/2011  . Helicobacter pylori gastritis 11/16/2014    EGD done 11/01/2014 by Gregory Hernandez to work up weight loss showed gastritis; biopsy showed chronic active gastritis with Helicobacter pylori organisms.  Gregory Hernandez notes indicate plan to start treatment with Prevpac.    Past Surgical History  Procedure Laterality Date  . No past surgeries     Family History  Problem Relation Age of Onset  . Hypertension Mother   . Diabetes Mother   . Cancer Cousin   . Heart attack Mother   . Peptic Ulcer Mother   . Arthritis  Mother    Social History  Substance Use Topics  . Smoking status: Former Smoker -- 2.00 packs/day for 7 years    Types: Cigarettes    Quit date: 08/18/2009  . Smokeless tobacco: Never Used  . Alcohol Use: 0.0 oz/week    0 Standard drinks or equivalent per week     Comment: About 2-3 beers per day but not every day per patient    Review of Systems  Constitutional: Negative for fever.  HENT: Positive for congestion.   Respiratory: Positive for cough. Negative for shortness of breath.   All other systems reviewed and are negative.  Allergies  Review of patient's allergies indicates no known allergies.  Home Medications   Prior to Admission medications   Medication Sig Start Date End Date Taking? Authorizing Provider  guaiFENesin (MUCINEX) 600 MG 12 hr tablet Take 1 tablet (600 mg total) by mouth 2 (two) times daily. 02/24/16   Gregory C Joy, PA-C  hydrochlorothiazide (HYDRODIURIL) 25 MG tablet Take 1 tablet (25 mg total) by mouth daily. 02/17/16   Gregory Filler, MD  loratadine (CLARITIN) 10 MG tablet Take 1 tablet (10 mg total) by mouth daily. 02/24/16   Gregory C Joy, PA-C  pravastatin (PRAVACHOL) 20 MG tablet Take 1 tablet (20 mg total) by mouth every evening. 02/17/16 02/16/17  Gregory Filler, MD   BP 133/89 mmHg  Pulse 100  Temp(Src) 99.3 F (37.4 C) (Oral)  Resp 18  Wt 100 lb (45.36 kg)  SpO2 98% Physical Exam  Constitutional: He appears well-developed and well-nourished. No distress.  HENT:  Head: Normocephalic and atraumatic.  Mouth/Throat: Oropharynx is clear and moist.  Eyes: Conjunctivae are normal.  Neck: Neck supple.  Cardiovascular: Normal rate, regular rhythm, normal heart sounds and intact distal pulses.   Pulmonary/Chest: Effort normal and breath sounds normal. No respiratory distress.  Abdominal: Soft. There is no tenderness. There is no guarding.  Musculoskeletal: Normal range of motion. He exhibits no edema or tenderness.  Lymphadenopathy:    He  has no cervical adenopathy.  Neurological: He is alert.  Skin: Skin is warm and dry. He is not diaphoretic.  Psychiatric: He has a normal mood and affect. His behavior is normal.  Nursing note and vitals reviewed.  ED Course  Procedures (including critical care time) DIAGNOSTIC STUDIES: Oxygen Saturation is 98% on RA, normal by my interpretation.    COORDINATION OF CARE: 1:08 PM-Discussed treatment plan which includes CXR, Claritin, Mucinex with pt at bedside and pt agreed to plan.    Imaging Review Dg Chest 2 View  02/24/2016  CLINICAL DATA:  Chills and congestion.  Cough.  Duration:  2 days. EXAM: CHEST  2 VIEW COMPARISON:  09/19/2014 FINDINGS: Linear scarring, right upper lobe. Mild thoracic spondylosis. No acute findings or significant change from the prior exam. Heart size within normal limits. IMPRESSION: 1. Chronic scarring in the right upper lobe. No acute/active cardiopulmonary disease is identified. Electronically Signed   By: Gregory Hernandez M.D.   On: 02/24/2016 12:27   I have personally reviewed and evaluated these images as part of my medical decision-making.   EKG Interpretation None      MDM   Final diagnoses:  Nasal congestion  Cough    Gregory Hernandez presents with cough and nasal congestion for the past 2 days.  Patient is nontoxic appearing, has no indication of sepsis or other serious illness, and is in no apparent distress. Patient mentions that he has been losing weight lately and this concerns him. States he has been evaluated by 2 physicians, who have given him conflicting answers. Patient is concerned that he has hyperthyroidism and is not being treated for it. A check of the patient's record shows low TSH. Claritin and Mucinex prescribed for the patient's cough and congestion. Referral to endocrinology for the hyperthyroidism. Return precautions discussed. Patient voiced understanding of these instructions and is comfortable with discharge.  Filed  Vitals:   02/24/16 1140 02/24/16 1346  BP: 133/89 128/85  Pulse: 100 86  Temp: 99.3 F (37.4 C)   TempSrc: Oral   Resp: 18 20  Weight: 45.36 kg   SpO2: 98% 99%     I personally performed the services described in this documentation, which was scribed in my presence. The recorded information has been reviewed and is accurate.    Lorayne Bender, PA-C 02/24/16 [place], MD 02/25/16 [phone]

## 2016-02-24 NOTE — ED Notes (Signed)
Declined W/C at D/C and was escorted to lobby by RN. 

## 2016-02-24 NOTE — Telephone Encounter (Signed)
Pt mother requesting the nurse to call back regarding thyroid.

## 2016-02-25 NOTE — Telephone Encounter (Signed)
Weight checked at the ED yesterday and has been stable for the last two years. I saw him in a visit just one week ago and he was doing well. You can offer him an acute care visit if there is something new, but I can also see him in my clinic on July 3 as well; he already has an appointment then. I am still waiting for his thyroid scan to be scheduled and completed.

## 2016-02-25 NOTE — Telephone Encounter (Signed)
Pt's mother states pt has lost 3 more pounds, she is afraid he is dying, she would like him seen appt made for wed at 62

## 2016-02-26 ENCOUNTER — Encounter: Payer: Self-pay | Admitting: Internal Medicine

## 2016-02-26 ENCOUNTER — Ambulatory Visit (INDEPENDENT_AMBULATORY_CARE_PROVIDER_SITE_OTHER): Payer: Medicare Other | Admitting: Internal Medicine

## 2016-02-26 VITALS — BP 121/84 | HR 92 | Temp 98.1°F | Ht 62.5 in | Wt 98.4 lb

## 2016-02-26 DIAGNOSIS — E0590 Thyrotoxicosis, unspecified without thyrotoxic crisis or storm: Secondary | ICD-10-CM

## 2016-02-26 NOTE — Progress Notes (Signed)
Patient ID: Gregory Hernandez, male   DOB: 08/21/1961, 55 y.o.   MRN: [id number]   Subjective:   Patient ID: Gregory Hernandez male   DOB: 14-Feb-1961 55 y.o.   MRN: [id number]  HPI: Gregory Hernandez is a 55 y.o. male with PMH as below, here for hyperthyroidism f/u.  Please see Problem-Based charting for the status of the patient's chronic medical issues.   Past Medical History  Diagnosis Date  . Elevated BP   . TB (tuberculosis) 1996    Pulmonary TB (right upper lobe infiltrate) diagnosed May 1996; treated at [facility] Department  . Dermatitis     H/O Tinea dermatitis  . Rhinitis     Seasonal  . Facial pain 2007    Left eye and left facial pain January 2007; evaluated by ophthalmologist Dr. Ishmael Holter; CT of face and orbits 09/16/2005 unremarkable; etiology unclear; improved by 10/28/2005.  . Tobacco abuse   . Elevated transaminase level     Mild elevation.  . Fracture of left shoulder 2012  . Adenomatous colon polyp 12/14/2011  . Helicobacter pylori gastritis 11/16/2014    EGD done 11/01/2014 by Dr. Michail Sermon to work up weight loss showed gastritis; biopsy showed chronic active gastritis with Helicobacter pylori organisms.  Dr. Kathline Magic notes indicate plan to start treatment with Prevpac.    Current Outpatient Prescriptions  Medication Sig Dispense Refill  . guaiFENesin (MUCINEX) 600 MG 12 hr tablet Take 1 tablet (600 mg total) by mouth 2 (two) times daily. 14 tablet 0  . hydrochlorothiazide (HYDRODIURIL) 25 MG tablet Take 1 tablet (25 mg total) by mouth daily. 90 tablet 3  . loratadine (CLARITIN) 10 MG tablet Take 1 tablet (10 mg total) by mouth daily. 30 tablet 1  . pravastatin (PRAVACHOL) 20 MG tablet Take 1 tablet (20 mg total) by mouth every evening. 90 tablet 3   No current facility-administered medications for this visit.   Family History  Problem Relation Age of Onset  . Hypertension Mother   . Diabetes Mother   . Cancer Cousin   . Heart attack Mother   . Peptic Ulcer  Mother   . Arthritis Mother    Social History   Social History  . Marital Status: Single    Spouse Name: N/A  . Number of Children: N/A  . Years of Education: N/A   Social History Main Topics  . Smoking status: Former Smoker -- 2.00 packs/day for 7 years    Types: Cigarettes    Quit date: 08/18/2009  . Smokeless tobacco: Never Used  . Alcohol Use: 0.0 oz/week    0 Standard drinks or equivalent per week     Comment: About 2-3 beers per day.  . Drug Use: No  . Sexual Activity: Not Asked   Other Topics Concern  . None   Social History Narrative   Review of Systems: No fevers, chills, tremors, CP, or SOB. Objective:  Physical Exam: Filed Vitals:   02/26/16 1418  BP: 121/84  Pulse: 92  Temp: 98.1 F (36.7 C)  TempSrc: Oral  Height: 5' 2.5" (1.588 m)  Weight: 98 lb 6.4 oz (44.634 kg)  SpO2: 100%   Physical Exam  Constitutional: He is oriented to person, place, and time.  Thin appearing male.  Sitting in chair. NAD  HENT:  Head: Normocephalic and atraumatic.  Eyes: EOM are normal. No scleral icterus.  Neck: No tracheal deviation present.  Cardiovascular: Normal rate, regular rhythm and normal heart sounds.   Pulmonary/Chest: Effort  normal and breath sounds normal. No stridor. No respiratory distress. He has no wheezes.  Musculoskeletal: He exhibits no edema.  Neurological: He is alert and oriented to person, place, and time.  Skin: Skin is warm and dry.     Assessment & Plan:   Patient and case were discussed with Dr. Beryle Beams.  Please refer to Problem Based charting for further documentation.

## 2016-02-26 NOTE — Assessment & Plan Note (Addendum)
Patient was seen by me in clinic 6/16.  At that visit, patient stated he was doing "great," and that he was called by the [facility] to return for a visit.  The patient had no complaints or concerns.  As the patient had nothing to discuss and he reported that he was simply told to come for an appointment, he was not charged for the visit.  However, the patient presented to the ED on 6/19, complaining of cough and continued weight loss, and requesting treatment for his hyperthyroidism (which is subclinical).  He has undergone extensive cancer workup by his PCP, with subclinical hyperthyroidism being the only abnormality.  His last TSH was low at 0.106, but fT4 and T3 were normal at 1.06 and 3.5, respectively.  Today patient's mother complains of weight loss and the patient "wasting away." She does not weigh him at home, but reports his clothes are becoming increasingly baggy and that he has no fat on his butt. He drinks Ensure 1-2x/week, but his mother cannot afford for him to drink it more often. The patient continues to drink 2 40oz beers at least once a week.  The patient is concerned that we are not treating his hyperthyroidism.  He has not had his thyroid scan yet.   A/P: Subclinical hyperthyroidism. Patient and his mother were shown his weight graph from clinic, demonstrating stable weight over the last year.  He was counseled that his thorough workup has been negative excepting subclinical hyperthyroidism, and that he may drink Ensure/Boost TID for weight gain.  Additionally, he was counseled to abstain from alcohol.  Patient and family were also counseled that he does NOT have hyperthyroidism, and his T4 is normal.  Therefore, he is not being treated because he does not need to be treated at this time.  His thyroid scan was scheduled prior to departure and he was provided with Boost samples and coupons. - Thyroid scan - Boost TID - STOP EtOH - RTC 7/3 with Dr. Evette Doffing

## 2016-02-26 NOTE — Patient Instructions (Signed)
1. Drink Ensure THREE times daily for extra calories and weight gain.  You may eat ice cream and chocolate as well to help. 2. STOP drinking alcohol. 3. Follow up with the scan of your thyroid. 4. Follow up with Dr. Evette Doffing on July 3rd.

## 2016-02-26 NOTE — Progress Notes (Signed)
Medicine attending: Medical history, presenting problems, physical findings, and medications, reviewed with resident physician Dr Nicholas Taylor on the day of the patient visit and I concur with his evaluation and management plan. 

## 2016-02-27 LAB — THYROID STIMULATING IMMUNOGLOBULIN: TSI: 40 % (ref 0–139)

## 2016-03-02 ENCOUNTER — Encounter (HOSPITAL_COMMUNITY): Admission: RE | Admit: 2016-03-02 | Payer: Medicare Other | Source: Ambulatory Visit

## 2016-03-03 ENCOUNTER — Ambulatory Visit (HOSPITAL_COMMUNITY)
Admission: RE | Admit: 2016-03-03 | Discharge: 2016-03-03 | Disposition: A | Discharge status: Home / Self Care | Payer: Medicare Other | Source: Ambulatory Visit | Attending: Student in an Organized Health Care Education/Training Program | Admitting: Student in an Organized Health Care Education/Training Program

## 2016-03-03 ENCOUNTER — Encounter (HOSPITAL_COMMUNITY): Payer: Medicare Other

## 2016-03-03 DIAGNOSIS — E0590 Thyrotoxicosis, unspecified without thyrotoxic crisis or storm: Secondary | ICD-10-CM

## 2016-03-03 MED ORDER — SODIUM IODIDE I 131 CAPSULE
6.9000 | Freq: Once | INTRAVENOUS | Status: AC | PRN
  Administered 2016-03-03: 6.9 via ORAL

## 2016-03-04 ENCOUNTER — Telehealth: Payer: Self-pay | Admitting: Student in an Organized Health Care Education/Training Program

## 2016-03-04 ENCOUNTER — Encounter (HOSPITAL_COMMUNITY)
Admission: RE | Admit: 2016-03-04 | Discharge: 2016-03-04 | Disposition: A | Discharge status: Home / Self Care | Payer: Medicare Other | Source: Ambulatory Visit | Attending: Student in an Organized Health Care Education/Training Program | Admitting: Student in an Organized Health Care Education/Training Program

## 2016-03-04 DIAGNOSIS — E0590 Thyrotoxicosis, unspecified without thyrotoxic crisis or storm: Secondary | ICD-10-CM | POA: Diagnosis not present

## 2016-03-04 DIAGNOSIS — R634 Abnormal weight loss: Secondary | ICD-10-CM | POA: Diagnosis not present

## 2016-03-04 MED ORDER — SODIUM PERTECHNETATE TC 99M INJECTION
10.0000 | Freq: Once | INTRAVENOUS | Status: AC | PRN
  Administered 2016-03-04: 10 via INTRAVENOUS

## 2016-03-04 NOTE — Telephone Encounter (Signed)
I called Gregory Hernandez to give the normal results of the radioactive iodine uptake scan. Likely he has a multinodular goiter with subclinical hypothyroidism. We will monitor annual TSH but I would not recommend methimazole treatment at this point.

## 2016-03-09 ENCOUNTER — Ambulatory Visit (INDEPENDENT_AMBULATORY_CARE_PROVIDER_SITE_OTHER): Payer: Medicare Other | Admitting: Student in an Organized Health Care Education/Training Program

## 2016-03-09 VITALS — BP 141/88 | HR 76 | Temp 97.8°F | Ht 62.5 in | Wt 101.0 lb

## 2016-03-09 DIAGNOSIS — I10 Essential (primary) hypertension: Secondary | ICD-10-CM

## 2016-03-09 DIAGNOSIS — D472 Monoclonal gammopathy: Secondary | ICD-10-CM | POA: Diagnosis present

## 2016-03-09 DIAGNOSIS — E0590 Thyrotoxicosis, unspecified without thyrotoxic crisis or storm: Secondary | ICD-10-CM

## 2016-03-09 DIAGNOSIS — E0580 Other thyrotoxicosis without thyrotoxic crisis or storm: Secondary | ICD-10-CM | POA: Diagnosis not present

## 2016-03-09 DIAGNOSIS — F1020 Alcohol dependence, uncomplicated: Secondary | ICD-10-CM | POA: Diagnosis not present

## 2016-03-09 DIAGNOSIS — R634 Abnormal weight loss: Secondary | ICD-10-CM | POA: Diagnosis not present

## 2016-03-09 DIAGNOSIS — F1010 Alcohol abuse, uncomplicated: Secondary | ICD-10-CM

## 2016-03-09 NOTE — Progress Notes (Signed)
   See Encounters tab for problem-based medical decision making  __________________________________________________________  HPI:  55 year old man who is here for follow-up of hyperthyroidism. Patient has been seen periodically by the clinic because of concerns for unintentional weight loss over the last few years. Workup so far has revealed subclinical hyperthyroidism. The patient went for a radioactive iodine uptake and scan about a month ago which returned with normal uptake. Currently he says that he is feeling well, weight is stable at 101 pounds. He says that he is eating and drinking better. He has cut back on his alcohol use, says that his last drink was over a month ago. Says that he did this on his own. He's got a girlfriend who visits him often and has been encouraging him to quit drinking. He lives on his own in an apartment, his mother is very involved in his life. He reports mostly good compliance with his medications, however ran out of them a few days ago and has yet to get his refills from the pharmacy. He says that he is sleeping well, good energy throughout the day, good appetite, normal bowel movements. No fevers or chills. No chest pain or dyspnea on exertion.  __________________________________________________________  Problem List: Patient Active Problem List   Diagnosis Date Noted  . MGUS (monoclonal gammopathy of unknown significance) 03/09/2016  . Subclinical hyperthyroidism 10/04/2015  . Hyperlipidemia 06/17/2015  . Alcohol over use 06/17/2015  . Loss of weight 05/23/2014  . Neutropenia ([place]) 04/21/2013  . Preventative health care 08/19/2011  . Hypertension 09/21/2008    Medications: Reconciled today in Epic __________________________________________________________  Physical Exam:  Vital Signs: Filed Vitals:   03/09/16 0830  BP: 141/88  Pulse: 76  Temp: 97.8 F (36.6 C)  TempSrc: Oral  Height: 5' 2.5" (1.588 m)  Weight: 101 lb (45.813 kg)  SpO2: 100%      Gen: Well appearing, NAD Neck: No cervical LAD, Diffusely enlarged thyroid without discrete nodule CV: RRR, no murmurs Pulm: Normal effort, CTA throughout, no wheezing Abd: Soft, NT, ND, normal BS.  Ext: Warm, no edema, normal joints Skin: No atypical appearing moles. No rashes

## 2016-03-09 NOTE — Patient Instructions (Signed)
1. Your weight today is 101 pounds, which has been the same for the last two years.   2. Your thyroid scan was normal. We do not need to start any new medications for your thyroid at this time. We will check your thyroid blood work every year.   3. Come back in 4 months and I will check your weight and give you a flu shot.  4. Keep taking your blood pressure and cholesterol medicines every day.   5. You are doing a great job staying away from beer and drinking. Keep it up, you will feel a lot better as a result.

## 2016-03-09 NOTE — Assessment & Plan Note (Signed)
Patient with chronically low weights, has been stable around 101 pounds since 2015. He's had a extensive workup for underlying disease which has revealed just subclinical hyperthyroidism and MGUS, neither which I think explains his weight loss. Rather this seems most consistent with his history of alcohol use disorder. Now that he is drastically cut back on his drinking and I'm hopeful that he will stabilize his weight. Plan is for weight recheck in 3 months.

## 2016-03-09 NOTE — Assessment & Plan Note (Signed)
Blood pressure is a little above goal today but he ran out of hydrochlorothiazide and has yet to get his refill. When he takes his medication he is usually well controlled. Plan will be to resume HCTZ 25 mg once daily and check renal function around October.

## 2016-03-09 NOTE — Assessment & Plan Note (Signed)
During evaluation of unintentional weight loss, SPEP showed a monoclonal gammopathy. Serum immunofixation October 2016 showed an IgG kappa monoclonal protein. The free light chains showed a normal kappa to lambda ratio. His CBC remains normal, renal function normal, calcium is normal. His urine protein electrophoresis was without monoclonal protein. There are no other signs that this is multiple myeloma, rather seems more consistent with MGUS. His weight is stabilized so I think we can watch this monoclonal protein with annual serum immunofixation and free light chains.

## 2016-03-09 NOTE — Assessment & Plan Note (Signed)
Reduced TSH but with normal T3 and free T4. Radial active iodine uptake and scan showed normal uptake without signs of Graves disease. Plan is to repeat TSH and free T4 at least annually, no indication for methimazole currently.

## 2016-04-15 DIAGNOSIS — H2513 Age-related nuclear cataract, bilateral: Secondary | ICD-10-CM | POA: Diagnosis not present

## 2016-05-14 ENCOUNTER — Emergency Department (HOSPITAL_COMMUNITY): Payer: Medicare Other

## 2016-05-14 ENCOUNTER — Encounter (HOSPITAL_COMMUNITY): Payer: Self-pay

## 2016-05-14 ENCOUNTER — Emergency Department (HOSPITAL_COMMUNITY)
Admission: EM | Admit: 2016-05-14 | Discharge: 2016-05-14 | Disposition: A | Discharge status: Home / Self Care | Payer: Medicare Other | Attending: Emergency Medicine | Admitting: Emergency Medicine

## 2016-05-14 DIAGNOSIS — Z79899 Other long term (current) drug therapy: Secondary | ICD-10-CM | POA: Diagnosis not present

## 2016-05-14 DIAGNOSIS — Z87891 Personal history of nicotine dependence: Secondary | ICD-10-CM | POA: Insufficient documentation

## 2016-05-14 DIAGNOSIS — I10 Essential (primary) hypertension: Secondary | ICD-10-CM | POA: Diagnosis not present

## 2016-05-14 DIAGNOSIS — R109 Unspecified abdominal pain: Secondary | ICD-10-CM | POA: Diagnosis present

## 2016-05-14 DIAGNOSIS — R634 Abnormal weight loss: Secondary | ICD-10-CM | POA: Insufficient documentation

## 2016-05-14 DIAGNOSIS — R112 Nausea with vomiting, unspecified: Secondary | ICD-10-CM | POA: Diagnosis not present

## 2016-05-14 DIAGNOSIS — R110 Nausea: Secondary | ICD-10-CM

## 2016-05-14 LAB — COMPREHENSIVE METABOLIC PANEL
ALBUMIN: 4.2 g/dL (ref 3.5–5.0)
ALT: 45 U/L (ref 17–63)
AST: 45 U/L — AB (ref 15–41)
Alkaline Phosphatase: 82 U/L (ref 38–126)
Anion gap: 7 (ref 5–15)
BUN: 6 mg/dL (ref 6–20)
CALCIUM: 10.5 mg/dL — AB (ref 8.9–10.3)
CO2: 33 mmol/L — ABNORMAL HIGH (ref 22–32)
Chloride: 97 mmol/L — ABNORMAL LOW (ref 101–111)
Creatinine, Ser: 0.57 mg/dL — ABNORMAL LOW (ref 0.61–1.24)
GFR calc Af Amer: >60 mL/min (ref >60)
GFR calc non Af Amer: >60 mL/min (ref >60)
GLUCOSE: 97 mg/dL (ref 65–99)
Potassium: 3.8 mmol/L (ref 3.5–5.1)
SODIUM: 137 mmol/L (ref 135–145)
TOTAL PROTEIN: 8 g/dL (ref 6.5–8.1)
Total Bilirubin: 1.3 mg/dL — ABNORMAL HIGH (ref 0.3–1.2)

## 2016-05-14 LAB — URINALYSIS, ROUTINE W REFLEX MICROSCOPIC
BILIRUBIN URINE: NEGATIVE
Glucose, UA: NEGATIVE mg/dL
Hgb urine dipstick: NEGATIVE
KETONES UR: NEGATIVE mg/dL
LEUKOCYTES UA: NEGATIVE
NITRITE: NEGATIVE
Protein, ur: NEGATIVE mg/dL
SPECIFIC GRAVITY, URINE: 1.018 (ref 1.005–1.030)
pH: 8.5 — ABNORMAL HIGH (ref 5.0–8.0)

## 2016-05-14 LAB — CBC
HCT: 44.7 % (ref 39.0–52.0)
HEMOGLOBIN: 14.8 g/dL (ref 13.0–17.0)
MCH: 28.3 pg (ref 26.0–34.0)
MCHC: 33.1 g/dL (ref 30.0–36.0)
MCV: 85.5 fL (ref 78.0–100.0)
Platelets: 257 10*3/uL (ref 150–400)
RBC: 5.23 MIL/uL (ref 4.22–5.81)
RDW: 13.4 % (ref 11.5–15.5)
WBC: 4.3 10*3/uL (ref 4.0–10.5)

## 2016-05-14 LAB — LIPASE, BLOOD: LIPASE: 24 U/L (ref 11–51)

## 2016-05-14 MED ORDER — THIAMINE HCL 100 MG/ML IJ SOLN
Freq: Once | INTRAVENOUS | Status: AC
  Administered 2016-05-14: 16:00:00 via INTRAVENOUS
  Filled 2016-05-14 (×2): qty 1000

## 2016-05-14 MED ORDER — SODIUM CHLORIDE 0.9 % IV SOLN
Freq: Once | INTRAVENOUS | Status: AC
Start: 2016-05-14 — End: 2016-05-14
  Administered 2016-05-14: 16:00:00 via INTRAVENOUS

## 2016-05-14 NOTE — ED Triage Notes (Signed)
Pt. Drink 2 40 ounces a day and has been having burning in his abdomen and n/v since Saturday.  Pt. 's last drink was SAturday.  He also reports having a decreased appetite.  Skin is warm and dry.  No acute distress noted.

## 2016-05-14 NOTE — Discharge Instructions (Signed)
Ensure twice daily.   You need to eat and drink.   Call the clinic tomorrow to schedule to be seen for evaluation

## 2016-05-14 NOTE — ED Provider Notes (Signed)
[facility] DEPT Provider Note   CSN: [id number] Arrival date & time: 05/14/16  1104     History   Chief Complaint Chief Complaint  Patient presents with  . Abdominal Pain    HPI Gregory Hernandez is a 55 y.o. male.  The history is provided by the patient. No language interpreter was used.  Abdominal Pain   This is a new problem. The current episode started more than 1 week ago. The problem occurs constantly. The problem has been gradually worsening. The pain is associated with eating. The patient is experiencing no pain. Nothing aggravates the symptoms. Nothing relieves the symptoms. Past workup does not include surgery. His past medical history does not include gallstones.  Pt reports he has trouble eating.  Pt reports he is trying to stop drinking.    Past Medical History:  Diagnosis Date  . Adenomatous colon polyp 12/14/2011  . Dermatitis    H/O Tinea dermatitis  . Elevated BP   . Elevated transaminase level    Mild elevation.  . Facial pain 2007   Left eye and left facial pain January 2007; evaluated by ophthalmologist Dr. Ishmael Holter; CT of face and orbits 09/16/2005 unremarkable; etiology unclear; improved by 10/28/2005.  . Fracture of left shoulder 2012  . Helicobacter pylori gastritis 11/16/2014   EGD done 11/01/2014 by Dr. Michail Sermon to work up weight loss showed gastritis; biopsy showed chronic active gastritis with Helicobacter pylori organisms.  Dr. Kathline Magic notes indicate plan to start treatment with Prevpac.   Marland Kitchen Rhinitis    Seasonal  . TB (tuberculosis) 1996   Pulmonary TB (right upper lobe infiltrate) diagnosed May 1996; treated at [facility]  . Tobacco abuse     Patient Active Problem List   Diagnosis Date Noted  . MGUS (monoclonal gammopathy of unknown significance) 03/09/2016  . Subclinical hyperthyroidism 10/04/2015  . Hyperlipidemia 06/17/2015  . Alcohol over use 06/17/2015  . Loss of weight 05/23/2014  . Neutropenia ([place]) 04/21/2013    . Preventative health care 08/19/2011  . Hypertension 09/21/2008    Past Surgical History:  Procedure Laterality Date  . NO PAST SURGERIES         Home Medications    Prior to Admission medications   Medication Sig Start Date End Date Taking? Authorizing Provider  hydrochlorothiazide (HYDRODIURIL) 25 MG tablet Take 1 tablet (25 mg total) by mouth daily. 02/17/16  Yes Axel Filler, MD  pravastatin (PRAVACHOL) 20 MG tablet Take 1 tablet (20 mg total) by mouth every evening. 02/17/16 02/16/17 Yes Axel Filler, MD    Family History Family History  Problem Relation Age of Onset  . Hypertension Mother   . Diabetes Mother   . Heart attack Mother   . Peptic Ulcer Mother   . Arthritis Mother   . Cancer Cousin     Social History Social History  Substance Use Topics  . Smoking status: Former Smoker    Packs/day: 2.00    Years: 7.00    Types: Cigarettes    Quit date: 08/18/2009  . Smokeless tobacco: Never Used  . Alcohol use 0.0 oz/week     Comment: About 2-3 beers per day. 2 40 ounces a day     Allergies   Review of patient's allergies indicates no known allergies.   Review of Systems Review of Systems  Gastrointestinal: Positive for abdominal pain.  All other systems reviewed and are negative.    Physical Exam Updated Vital Signs BP 103/90   Pulse 66  Temp 98.6 F (37 C) (Oral)   Resp 20   Ht 5\' 2"  (1.575 m)   Wt 43.5 kg   SpO2 100%   BMI 17.56 kg/m   Physical Exam  Constitutional: He appears well-developed.  Very thin,    HENT:  Head: Normocephalic and atraumatic.  Eyes: Conjunctivae are normal.  Neck: Neck supple.  Cardiovascular: Normal rate and regular rhythm.   No murmur heard. Pulmonary/Chest: Effort normal and breath sounds normal. No respiratory distress.  Abdominal: Soft. There is no tenderness.  Musculoskeletal: He exhibits no edema.  Neurological: He is alert.  Skin: Skin is warm and dry.  Psychiatric: He has a  normal mood and affect.  Nursing note and vitals reviewed.    ED Treatments / Results  Labs (all labs ordered are listed, but only abnormal results are displayed) Labs Reviewed  COMPREHENSIVE METABOLIC PANEL - Abnormal; Notable for the following:       Result Value   Chloride 97 (*)    CO2 33 (*)    Creatinine, Ser 0.57 (*)    Calcium 10.5 (*)    AST 45 (*)    Total Bilirubin 1.3 (*)    All other components within normal limits  URINALYSIS, ROUTINE W REFLEX MICROSCOPIC (NOT AT [facility]) - Abnormal; Notable for the following:    pH 8.5 (*)    All other components within normal limits  LIPASE, BLOOD  CBC    EKG  EKG Interpretation None       Radiology Dg Chest 2 View  Result Date: 05/14/2016 CLINICAL DATA:  Nausea, vomiting, burning sensation EXAM: CHEST  2 VIEW COMPARISON:  02/24/2016, 07/09/2014 FINDINGS: There is chronic focal right upper lung scarring. There is no focal parenchymal opacity. There is no pleural effusion or pneumothorax. The heart and mediastinal contours are unremarkable. The osseous structures are unremarkable. IMPRESSION: No active cardiopulmonary disease. Electronically Signed   By: Kathreen Devoid   On: 05/14/2016 16:18    Procedures Procedures (including critical care time)  Medications Ordered in ED Medications  0.9 %  sodium chloride infusion ( Intravenous Stopped 05/14/16 1951)  sodium chloride 0.9 % 1,000 mL with thiamine [id number] mg, folic acid 1 mg, multivitamins adult 10 mL infusion ( Intravenous Stopped 05/14/16 1951)     Initial Impression / Assessment and Plan / ED Course  I have reviewed the triage vital signs and the nursing notes.  Pertinent labs & imaging results that were available during my care of the patient were reviewed by me and considered in my medical decision making (see chart for details).  Clinical Course    Pt given IV fluids,  Thiamine, folate and multi vitamin.  Pt feels better.  Pt ate a sandwich and drank fluids.  Pt looks  better.  I spoke to pt about increasing po intake.  I contacted Internal medicine resident.  They will call pt with appointment time to follow up for recheck.  Ensure and multivitamin at home/  Final Clinical Impressions(s) / ED Diagnoses   Final diagnoses:  Nausea  Weight loss    New Prescriptions New Prescriptions   No medications on file     Fransico Meadow, PA-C 05/14/16 2000    Duffy Bruce, MD 05/15/16 1300

## 2016-05-14 NOTE — ED Notes (Signed)
Pt d/c home with mother. Pt ambulatory

## 2016-05-14 NOTE — ED Notes (Signed)
Patient transported to X-ray 

## 2016-05-20 ENCOUNTER — Ambulatory Visit (INDEPENDENT_AMBULATORY_CARE_PROVIDER_SITE_OTHER): Payer: Medicare Other | Admitting: Internal Medicine

## 2016-05-20 DIAGNOSIS — R638 Other symptoms and signs concerning food and fluid intake: Secondary | ICD-10-CM

## 2016-05-20 DIAGNOSIS — Z23 Encounter for immunization: Secondary | ICD-10-CM | POA: Diagnosis present

## 2016-05-20 DIAGNOSIS — Z87891 Personal history of nicotine dependence: Secondary | ICD-10-CM | POA: Diagnosis not present

## 2016-05-20 DIAGNOSIS — F1010 Alcohol abuse, uncomplicated: Secondary | ICD-10-CM | POA: Diagnosis not present

## 2016-05-20 DIAGNOSIS — R634 Abnormal weight loss: Secondary | ICD-10-CM | POA: Diagnosis present

## 2016-05-20 NOTE — Assessment & Plan Note (Signed)
A: Patient reports abstinence from alcohol since his recent ED visit but prior to that was having about 1-2 forty ounce beers per day.  He has not experienced any alcohol withdrawal symptoms.  P: - we will continue to monitor. - I encouraged him to continue his abstinence and follow up with his PCP in November.

## 2016-05-20 NOTE — Assessment & Plan Note (Signed)
A: Patient with recent ED visit for his chronic weight loss and lack of adequate PO intake.  Treated in the ED with IV fluids, a sandwich, and PO fluids.  Felt well afterwards and discharged home.  Here today for follow up and reports no new complaints.  States he is hungry today.  We discussed how his chronic low weight is most consistent with his EtOH use disorder.  He states he has not had alcohol since his ED visit.  P: - we will continue to encourage his abstinence from alcohol.  His weight is 97 pounds today which is down about 4 pounds since July. - we encouraged him to continue to have good PO intake - he will follow up with his PCP in November as planned for weight re-check.

## 2016-05-20 NOTE — Progress Notes (Signed)
CC: here for ED follow up visit for loss of weight  HPI:  [name] is a 55 y.o. man with a past medical history listed below here today for follow up of his recent ED visit due to weight loss.  This is a chronic issue.  In the ED patient had no obvious lab abnormalities on CBC, CMET, UA.  He was given IVF, thiamine, folate, multivitamin.  He ate a sandwich and drank some fluids and felt better.  He was discharged home to increase his PO intake.  He reports trying to do so and states he has abstained from alcohol since ED visit.  He has had no abdominal pain, nausea, vomiting, or diarrhea.  He reports being hungry today.  He is also without fever or chills.  For details of today's visit and the status of his chronic medical issues please refer to the assessment and plan.   Past Medical History:  Diagnosis Date  . Adenomatous colon polyp 12/14/2011  . Dermatitis    H/O Tinea dermatitis  . Elevated BP   . Elevated transaminase level    Mild elevation.  . Facial pain 2007   Left eye and left facial pain January 2007; evaluated by ophthalmologist Dr. Ishmael Holter; CT of face and orbits 09/16/2005 unremarkable; etiology unclear; improved by 10/28/2005.  . Fracture of left shoulder 2012  . Helicobacter pylori gastritis 11/16/2014   EGD done 11/01/2014 by Dr. Michail Sermon to work up weight loss showed gastritis; biopsy showed chronic active gastritis with Helicobacter pylori organisms.  Dr. Kathline Magic notes indicate plan to start treatment with Prevpac.   Marland Kitchen Rhinitis    Seasonal  . TB (tuberculosis) 1996   Pulmonary TB (right upper lobe infiltrate) diagnosed May 1996; treated at [facility]  . Tobacco abuse     Review of Systems:   Please see pertinent ROS reviewed in HPI and problem based charting.  Physical Exam:  Vitals:   05/20/16 0941  BP: 124/90  Pulse: 76  Temp: 97.8 F (36.6 C)  TempSrc: Oral  SpO2: 100%  Weight: 97 lb 1.6 oz (44 kg)  Height: 5' 2.5"  (1.588 m)   Physical Exam  Constitutional: He is oriented to person, place, and time.  Thin, well-appearing, no acute distress.  HENT:  Head: Normocephalic and atraumatic.  Cardiovascular: Normal rate and regular rhythm.   Pulmonary/Chest: Effort normal and breath sounds normal.  Abdominal: Soft. He exhibits no distension. There is no tenderness. There is no rebound and no guarding.  Neurological: He is alert and oriented to person, place, and time.  Skin: Skin is warm and dry.     Assessment & Plan:   See Encounters Tab for problem based charting.  Patient discussed with Dr. Dareen Piano.  Encounter for immunization A/P: - influenza vaccine given today.  Loss of weight A: Patient with recent ED visit for his chronic weight loss and lack of adequate PO intake.  Treated in the ED with IV fluids, a sandwich, and PO fluids.  Felt well afterwards and discharged home.  Here today for follow up and reports no new complaints.  States he is hungry today.  We discussed how his chronic low weight is most consistent with his EtOH use disorder.  He states he has not had alcohol since his ED visit.  P: - we will continue to encourage his abstinence from alcohol.  His weight is 97 pounds today which is down about 4 pounds since July. - we encouraged him  to continue to have good PO intake - he will follow up with his PCP in November as planned for weight re-check.  Alcohol over use A: Patient reports abstinence from alcohol since his recent ED visit but prior to that was having about 1-2 forty ounce beers per day.  He has not experienced any alcohol withdrawal symptoms.  P: - we will continue to monitor. - I encouraged him to continue his abstinence and follow up with his PCP in November.

## 2016-05-20 NOTE — Assessment & Plan Note (Signed)
A/P: - influenza vaccine given today.

## 2016-05-20 NOTE — Patient Instructions (Addendum)
Thank you for coming to see me today. It was a pleasure. Today we talked about:   You are doing great.    Continue avoiding alcohol like you have been doing.  Congratulation on this.  Continue trying to eat enough food to keep your weight up and having to avoid going to the emergency department.  We gave you the flu shot today.  Please follow-up with Dr. Evette Doffing in November.  You can always come back sooner if you need to.  If you have any questions or concerns, please do not hesitate to call the office at [phone].  Take Care,   Jule Ser, DO

## 2016-05-22 NOTE — Progress Notes (Signed)
Internal Medicine Clinic Attending  Case discussed with Dr. Wallace at the time of the visit.  We reviewed the resident's history and exam and pertinent patient test results.  I agree with the assessment, diagnosis, and plan of care documented in the resident's note.  

## 2016-07-20 ENCOUNTER — Ambulatory Visit (INDEPENDENT_AMBULATORY_CARE_PROVIDER_SITE_OTHER): Payer: Medicare Other | Admitting: Student in an Organized Health Care Education/Training Program

## 2016-07-20 ENCOUNTER — Encounter: Payer: Self-pay | Admitting: Student in an Organized Health Care Education/Training Program

## 2016-07-20 VITALS — BP 136/77 | HR 85 | Temp 97.5°F | Ht 60.0 in | Wt 99.7 lb

## 2016-07-20 DIAGNOSIS — Z23 Encounter for immunization: Secondary | ICD-10-CM | POA: Diagnosis not present

## 2016-07-20 DIAGNOSIS — E785 Hyperlipidemia, unspecified: Secondary | ICD-10-CM | POA: Diagnosis not present

## 2016-07-20 DIAGNOSIS — I10 Essential (primary) hypertension: Secondary | ICD-10-CM | POA: Diagnosis not present

## 2016-07-20 DIAGNOSIS — F1021 Alcohol dependence, in remission: Secondary | ICD-10-CM

## 2016-07-20 DIAGNOSIS — Z681 Body mass index (BMI) 19 or less, adult: Secondary | ICD-10-CM | POA: Diagnosis not present

## 2016-07-20 DIAGNOSIS — D472 Monoclonal gammopathy: Secondary | ICD-10-CM | POA: Diagnosis not present

## 2016-07-20 DIAGNOSIS — E7800 Pure hypercholesterolemia, unspecified: Secondary | ICD-10-CM

## 2016-07-20 DIAGNOSIS — Z87891 Personal history of nicotine dependence: Secondary | ICD-10-CM | POA: Diagnosis not present

## 2016-07-20 DIAGNOSIS — Z79899 Other long term (current) drug therapy: Secondary | ICD-10-CM | POA: Diagnosis not present

## 2016-07-20 DIAGNOSIS — R634 Abnormal weight loss: Secondary | ICD-10-CM | POA: Diagnosis not present

## 2016-07-20 DIAGNOSIS — F1010 Alcohol abuse, uncomplicated: Secondary | ICD-10-CM

## 2016-07-20 NOTE — Assessment & Plan Note (Signed)
Weight is improving nicely. He is is up two pounds since last visit. BMI 17.5. No clear cause identified in our workup, probably alcohol use disorder is the most likely cause. We are also actively monitoring subclinical hypothyroidism and MGUS as potential culprits.

## 2016-07-20 NOTE — Patient Instructions (Signed)
1. Take your medicines as listed here.   2. I am going to check your blood work today, to keep an eye on the proteins in your blood.   3. Exercise outside the house everyday, including walking.

## 2016-07-20 NOTE — Assessment & Plan Note (Signed)
Blood pressure is at goal. Plan to continue HCTZ. Labs due by September 2018.

## 2016-07-20 NOTE — Assessment & Plan Note (Signed)
Stable, in remission currently. Not interested in naltrexone therapy so we will continue to monitor.

## 2016-07-20 NOTE — Assessment & Plan Note (Signed)
No side effects from statin, this is for primary prevention of ischemic vascular disease. Plan to continue pravastatin. I plan to continue this medicine indefinitely and I don't think there is much benefit to repeating lipids in him.

## 2016-07-20 NOTE — Progress Notes (Signed)
Assessment and Plan:  See Encounters tab for problem-based medical decision making.   __________________________________________________________  HPI:  55 year old man coming in today for follow-up of unintentional weight loss. We have been watching his weight closely over the last two years as it has fallen from 107lbs to a low of 96lbs. Today he presented with a weights up to 99 pounds, BMI 17.5. He feels well, has good energy. He lives with his mother, does not work. Reports that he exercises every day, usually walks. Feels like his energy level is good. He has cut way back down on his alcohol use. Previously he was drinking two 40 ounce beers a day, now he says that he is been essentially abstinent only having one beer a few weeks ago on his birthday. Thinks that cutting back on his alcohol is made him feel a lot better. He reports eating and drinking well. No recent fevers or chills. He had one visit to the emergency department in September for nausea and abdominal discomfort which resolved with supportive care.   __________________________________________________________  Problem List: Patient Active Problem List   Diagnosis Date Noted  . Loss of weight 05/23/2014    Priority: High  . Hypertension 09/21/2008    Priority: High  . MGUS (monoclonal gammopathy of unknown significance) 03/09/2016    Priority: Medium  . Subclinical hyperthyroidism 10/04/2015    Priority: Medium  . Hyperlipidemia 06/17/2015    Priority: Medium  . Alcohol Use Disorder 06/17/2015    Priority: Medium  . Preventative health care 08/19/2011    Priority: Low    Medications: Reconciled today in Epic __________________________________________________________  Physical Exam:  Vital Signs: Vitals:   07/20/16 1029  BP: 136/77  Pulse: 85  Temp: 97.5 F (36.4 C)  TempSrc: Oral  SpO2: 100%  Weight: 99 lb 11.2 oz (45.2 kg)  Height: 5' (1.524 m)    Gen: thin appearing man, looks well, reading a book  in the office while waiting ENT: OP clear without erythema or exudate, poor dentition CV: RRR, no murmurs Pulm: Normal effort, CTA throughout, no wheezing Abd: Soft, NT, ND, no organomegaly Ext: Warm, no edema, normal joints Skin: No atypical appearing moles. No rashes

## 2016-07-20 NOTE — Assessment & Plan Note (Signed)
Gregory Hernandez seen on immunofixation in April 2016. Had a normal recent CBC. No clinical signs of progression to MM. Plan is for annual monitoring, so will check serum IFE and light chains today.

## 2016-07-21 LAB — IMMUNOFIXATION, SERUM
IGA/IMMUNOGLOBULIN A, SERUM: 349 mg/dL (ref 90–386)
IGG (IMMUNOGLOBIN G), SERUM: 1488 mg/dL (ref 700–1600)
IGM (IMMUNOGLOBULIN M), SRM: 71 mg/dL (ref 20–172)

## 2016-07-21 LAB — KAPPA/LAMBDA LIGHT CHAINS
IG KAPPA FREE LIGHT CHAIN: 21.4 mg/L — AB (ref 3.3–19.4)
IG LAMBDA FREE LIGHT CHAIN: 11.7 mg/L (ref 5.7–26.3)
KAPPA/LAMBDA FLC RATIO: 1.83 — AB (ref 0.26–1.65)

## 2016-07-22 ENCOUNTER — Encounter: Payer: Self-pay | Admitting: Student in an Organized Health Care Education/Training Program

## 2016-11-20 ENCOUNTER — Telehealth: Payer: Self-pay | Admitting: Student in an Organized Health Care Education/Training Program

## 2016-11-20 NOTE — Telephone Encounter (Signed)
APT. REMINDER CALL, LMTCB °

## 2016-11-23 ENCOUNTER — Ambulatory Visit (INDEPENDENT_AMBULATORY_CARE_PROVIDER_SITE_OTHER): Payer: Medicare Other | Admitting: Student in an Organized Health Care Education/Training Program

## 2016-11-23 VITALS — BP 134/76 | HR 83 | Temp 97.7°F | Wt 102.4 lb

## 2016-11-23 DIAGNOSIS — D472 Monoclonal gammopathy: Secondary | ICD-10-CM

## 2016-11-23 DIAGNOSIS — R634 Abnormal weight loss: Secondary | ICD-10-CM | POA: Diagnosis not present

## 2016-11-23 DIAGNOSIS — M1712 Unilateral primary osteoarthritis, left knee: Secondary | ICD-10-CM | POA: Diagnosis not present

## 2016-11-23 DIAGNOSIS — F1010 Alcohol abuse, uncomplicated: Secondary | ICD-10-CM

## 2016-11-23 DIAGNOSIS — I10 Essential (primary) hypertension: Secondary | ICD-10-CM | POA: Diagnosis present

## 2016-11-23 DIAGNOSIS — M25462 Effusion, left knee: Secondary | ICD-10-CM

## 2016-11-23 DIAGNOSIS — Z87891 Personal history of nicotine dependence: Secondary | ICD-10-CM | POA: Diagnosis not present

## 2016-11-23 DIAGNOSIS — Z6820 Body mass index (BMI) 20.0-20.9, adult: Secondary | ICD-10-CM

## 2016-11-23 DIAGNOSIS — Z79899 Other long term (current) drug therapy: Secondary | ICD-10-CM

## 2016-11-23 DIAGNOSIS — E0590 Thyrotoxicosis, unspecified without thyrotoxic crisis or storm: Secondary | ICD-10-CM | POA: Diagnosis not present

## 2016-11-23 DIAGNOSIS — F1020 Alcohol dependence, uncomplicated: Secondary | ICD-10-CM

## 2016-11-23 LAB — SYNOVIAL CELL COUNT + DIFF, W/ CRYSTALS
CRYSTALS FLUID: NONE SEEN
Lymphocytes-Synovial Fld: 92 % — ABNORMAL HIGH (ref 0–20)
MONOCYTE-MACROPHAGE-SYNOVIAL FLUID: 6 % — AB (ref 50–90)
Neutrophil, Synovial: 2 % (ref 0–25)
WBC, Synovial: 34 /mm3 (ref 0–200)

## 2016-11-23 NOTE — Assessment & Plan Note (Signed)
Physical exam and history are suggestive of moderate osteoarthritis of the left knee. We talked about conservative management which has been of limited benefit for him over the last few weeks. We decided to drain his moderate effusion and instill steroids for symptomatic relief of the knee effusion. The arthrocentesis well with no immediate complications. Synovial fluid has only 95 white blood cells and no crystals.

## 2016-11-23 NOTE — Assessment & Plan Note (Signed)
Blood pressure is well controlled today. Plan to continue HCTZ 25 mg daily. Obtain basic metabolic panel in 4 months.

## 2016-11-23 NOTE — Assessment & Plan Note (Signed)
Patient has gained 3 pounds since her last visit. Current weight is 102 pounds with BMI around 20. I think the biggest contributor to his weight loss previously was alcohol use disorder which seems to be better controlled currently. We'll plan to continue to monitor his weight at follow-up visits.

## 2016-11-23 NOTE — Assessment & Plan Note (Addendum)
Stable. Patient continues to drink 1-2 beers multiple times a week. He says that he usually just drinks the tall cans. Symptomatically otherwise doing well. We've offered him naltrexone in the past but he has declined. We will continue to monitor.

## 2016-11-23 NOTE — Patient Instructions (Signed)
Knee Injection, Care After Refer to this sheet in the next few weeks. These instructions provide you with information about caring for yourself after your procedure. Your health care provider may also give you more specific instructions. Your treatment has been planned according to current medical practices, but problems sometimes occur. Call your health care provider if you have any problems or questions after your procedure. What can I expect after the procedure? After the procedure, it is common to have:  Soreness.  Warmth.  Swelling. You may have more pain, swelling, and warmth than you did before the injection. This reaction may last for about one day. Follow these instructions at home: Bathing   If you were given a bandage (dressing), keep it dry until your health care provider says it can be removed. Ask your health care provider when you can start showering or taking a bath. Managing pain, stiffness, and swelling   If directed, apply ice to the injection area:  Put ice in a plastic bag.  Place a towel between your skin and the bag.  Leave the ice on for 20 minutes, 2-3 times per day.  Do not apply heat to your knee.  Raise the injection area above the level of your heart while you are sitting or lying down. Activity   Avoid strenuous activities for as long as directed by your health care provider. Ask your health care provider when you can return to your normal activities. General instructions   Take medicines only as directed by your health care provider.  Do not take aspirin or other over-the-counter medicines unless your health care provider says you can.  Check your injection site every day for signs of infection. Watch for:  Redness, swelling, or pain.  Fluid, blood, or pus.  Follow your health care provider's instructions about dressing changes and removal. Contact a health care provider if:  You have symptoms at your injection site that last longer than  two days after your procedure.  You have redness, swelling, or pain in your injection area.  You have fluid, blood, or pus coming from your injection site.  You have warmth in your injection area.  You have a fever.  Your pain is not controlled with medicine. Get help right away if:  Your knee turns very red.  Your knee becomes very swollen.  Your knee pain is severe. This information is not intended to replace advice given to you by your health care provider. Make sure you discuss any questions you have with your health care provider. Document Released: 09/14/2014 Document Revised: 04/29/2016 Document Reviewed: 07/04/2014 Elsevier Interactive Patient Education  2017 [employer]  

## 2016-11-23 NOTE — Progress Notes (Signed)
Assessment and Plan:  See Encounters tab for problem-based medical decision making.   __________________________________________________________  HPI:  56 year old man comes in today for follow-up of weight loss. This was a significant problem back in 2016 when he precipitously lost about 20 pounds and was underweight by BMI. He had a workup for Malignancy which was reassuring. We did find that he had subclinical hyperthyroidism which is not currently need treatments. He also had an IgG G kappa monoclonal gammopathy which is also likely not causing him problems. He's been working on his alcohol use disorder and has cut back his alcohol intake significantly. Currently he reports drinking 1-2 large cans of beer per day. Says he is really trying not to drink more than that. Denies withdrawals. Otherwise doing well at home. Reports good compliance with his medications, though did not bring them to his visit today. Patient lives with family, does not work. No tobacco use. Today he is complaining of new left knee pain. He says that this comes up every few months, usually related to the weather. He thinks it's hurting because it's going to rain later this week. He does describe the knee is swollen. Also said that the skin was warm this morning. He is able to walk on it. Denies any trauma. Has been using Tylenol at home with very little benefit.  __________________________________________________________  Problem List: Patient Active Problem List   Diagnosis Date Noted  . Loss of weight 05/23/2014    Priority: High  . Hypertension 09/21/2008    Priority: High  . MGUS (monoclonal gammopathy of unknown significance) 03/09/2016    Priority: Medium  . Subclinical hyperthyroidism 10/04/2015    Priority: Medium  . Hyperlipidemia 06/17/2015    Priority: Medium  . Alcohol Use Disorder 06/17/2015    Priority: Medium  . Preventative health care 08/19/2011    Priority: Low  . Effusion of left knee  11/23/2016    Medications: Reconciled today in Epic __________________________________________________________  Physical Exam:  Vital Signs: Vitals:   11/23/16 1007  BP: 134/76  Pulse: 83  Temp: 97.7 F (36.5 C)  TempSrc: Oral  SpO2: 100%  Weight: 102 lb 6.4 oz (46.4 kg)    Gen: thin appearing man ENT: poor dentition CV: RRR, no murmur Pulm: Normal effort, CTA throughout, no wheezing Abd: Soft, NT, ND, no organomegaly Ext: Warm, no edema, left knee with moderate effusion, moderate crepitus, no laxity, no erythema or warmth Skin: No atypical appearing moles. No rashes   Knee Arthrocentesis with Injection Procedure Note  Diagnosis: left knee osteoarthrits  Indications: Symptomatic relief of large effusion  Anesthesia: not required  Procedure Details   Point of care ultrasound was used to identify the joint effusion and plan needle trajectory and depth. Consent was obtained for the procedure. The joint was prepped with Betadine. A 22 gauge needle was inserted into the superior aspect of the joint from a medial approach to access the suprapatellar pouch. 15 ml of clear yellow fluid was removed from the joint and sent to the lab for analysis. 2 ml 1% lidocaine and 40 mg of Triamcinolone was then injected into the joint through the same needle. The needle was removed and the area cleansed and dressed.  Complications:  None; patient tolerated the procedure well.  Sagittal view of left knee showing moderate sized free flowing effusion in the suprapatellar bursa.

## 2017-02-04 ENCOUNTER — Encounter: Payer: Self-pay | Admitting: Internal Medicine

## 2017-02-04 ENCOUNTER — Ambulatory Visit (INDEPENDENT_AMBULATORY_CARE_PROVIDER_SITE_OTHER): Payer: Medicare Other | Admitting: Internal Medicine

## 2017-02-04 VITALS — BP 139/84 | HR 74 | Temp 98.4°F | Ht 60.0 in | Wt 100.0 lb

## 2017-02-04 DIAGNOSIS — Z87891 Personal history of nicotine dependence: Secondary | ICD-10-CM

## 2017-02-04 DIAGNOSIS — M25462 Effusion, left knee: Secondary | ICD-10-CM

## 2017-02-04 LAB — SYNOVIAL CELL COUNT + DIFF, W/ CRYSTALS
CRYSTALS FLUID: NONE SEEN
WBC, Synovial: 6 /mm3 (ref 0–200)

## 2017-02-04 MED ORDER — IBUPROFEN 800 MG PO TABS: 800.0000 mg | ORAL_TABLET | Freq: Three times a day (TID) | ORAL | 0 refills | Status: DC | PRN

## 2017-02-04 NOTE — Patient Instructions (Addendum)
[name],  It was a pleasure to see you today. For your knee pain, please take ibuprofen 800 mg every 8 hours as needed. I have sent a prescription to your pharmacy. I will call you with the results of your knee fluid analysis when it results. If you develop fevers, chills, or your knee swelling recurs please give [place] a call. I would like for you to follow up with Dr. Evette Doffing in 1-2 weeks. If you have any questions or concerns, call our clinic at [phone] or after hours call [phone] and ask for the internal medicine resident on call. Thank you!  - Dr. Philipp Ovens

## 2017-02-04 NOTE — Progress Notes (Signed)
   CC: Knee pain and swelling   HPI:  [name] is a 56 y.o. male with past medical history outlined below here for recurrent knee pain and swelling. For the details of today's visit, please refer to the assessment and plan.  Past Medical History:  Diagnosis Date  . Adenomatous colon polyp 12/14/2011  . Dermatitis    H/O Tinea dermatitis  . Elevated BP   . Elevated transaminase level    Mild elevation.  . Facial pain 2007   Left eye and left facial pain January 2007; evaluated by ophthalmologist Dr. Ishmael Holter; CT of face and orbits 09/16/2005 unremarkable; etiology unclear; improved by 10/28/2005.  . Fracture of left shoulder 2012  . Helicobacter pylori gastritis 11/16/2014   EGD done 11/01/2014 by Dr. Michail Sermon to work up weight loss showed gastritis; biopsy showed chronic active gastritis with Helicobacter pylori organisms.  Dr. Kathline Magic notes indicate plan to start treatment with Prevpac.   Marland Kitchen Rhinitis    Seasonal  . TB (tuberculosis) 1996   Pulmonary TB (right upper lobe infiltrate) diagnosed May 1996; treated at [facility]  . Tobacco abuse     Review of Systems:  Denies fevers and chills.   Physical Exam:  Vitals:   02/04/17 1446  BP: 139/84  Pulse: 74  Temp: 98.4 F (36.9 C)  TempSrc: Oral  SpO2: 100%  Weight: 100 lb (45.4 kg)  Height: 5' (1.524 m)   Constitutional: NAD, appears comfortable Cardiovascular: RRR Pulmonary/Chest: CTAB Extremities: Left knee with moderate sized effusion, no evidence of erythema and not warm to touch. ROM intact. +ballottement Psychiatric: Normal mood and affect  Assessment & Plan:   See Encounters Tab for problem based charting.  Patient seen with Dr. Daryll Drown

## 2017-02-04 NOTE — Assessment & Plan Note (Addendum)
Patient is here today with left knee pain and swelling. He was seen in clinic on 11/23/2016 and noted to have a moderate effusion with evidence of osteoarthritis on physical exam. He underwent arthrocentesis with subsequent steroid injection for symptomatic relief. Fluid analysis had only 95 white blood cells and no crystals. On exam today, his effusion has recurred. He reports this happened yesterday evening. He is endorsing significant pain especially with weightbearing activities. Patient agreed to have a second arthrocentesis for both diagnostic and therapeutic purposes. Approximately 7cc of synovial fluid was drained with immediate relief in his pain. Fluid was yellow and clear, but did have some white precipitate.  -- Ibuprofen 800 mg q8 hours -- F/u cell count, diff, and crystal analysis  -- Follow up 1-2 weeks  -- Provided return precautions if patient becomes febrile or swelling recurs   Procedure Note: After consent was obtained, using sterile technique the left knee was prepped and 2 ml's of 1% plain Lidocaine used to anesthetize the needle tract into the joint from the medial infrapatellar approach. The knee joint was entered and 7 ml's of clear yellow colored fluid was withdrawn and sent for cell count, diff, and crystal analysis. The procedure was well tolerated and provided immediate relief of his pain.  Patient was instructed to watch for fever, or increased swelling or persistent pain in knee.  ADDENDUM: Synovial fluid with no evidence of crystals and only 6 wbcs. Called patient and updated him with results. Reports taking ibuprofen for the pain and that his knee is feeling better. He has follow up scheduled next week in [facility].

## 2017-02-08 NOTE — Progress Notes (Signed)
Internal Medicine Clinic Attending  I saw and evaluated the patient.  I personally confirmed the key portions of the history and exam documented by Dr. Philipp Ovens and I reviewed pertinent patient test results.  The assessment, diagnosis, and plan were formulated together and I agree with the documentation in the resident's note.  I was present for procedure.

## 2017-02-11 ENCOUNTER — Ambulatory Visit (INDEPENDENT_AMBULATORY_CARE_PROVIDER_SITE_OTHER): Payer: Medicare Other | Admitting: Internal Medicine

## 2017-02-11 ENCOUNTER — Encounter: Payer: Self-pay | Admitting: Internal Medicine

## 2017-02-11 VITALS — BP 154/90 | HR 97 | Temp 97.8°F | Ht 60.0 in | Wt 98.0 lb

## 2017-02-11 DIAGNOSIS — I10 Essential (primary) hypertension: Secondary | ICD-10-CM | POA: Diagnosis not present

## 2017-02-11 DIAGNOSIS — M25462 Effusion, left knee: Secondary | ICD-10-CM | POA: Diagnosis not present

## 2017-02-11 DIAGNOSIS — Z79899 Other long term (current) drug therapy: Secondary | ICD-10-CM | POA: Diagnosis not present

## 2017-02-11 DIAGNOSIS — Z87891 Personal history of nicotine dependence: Secondary | ICD-10-CM

## 2017-02-11 NOTE — Progress Notes (Signed)
   CC: Patient is here for a follow-up of his left knee effusion. Hypertension was also discussed during this visit.  HPI: [name] is a 56 y.o. male with a past medical history of conditions listed below presenting to the clinic for follow-up of his left knee effusion. Hypertension was also discussed doing this visit. Please see problem based charting for the status of the patient's current and chronic medical conditions.   Past Medical History:  Diagnosis Date  . Adenomatous colon polyp 12/14/2011  . Dermatitis    H/O Tinea dermatitis  . Elevated BP   . Elevated transaminase level    Mild elevation.  . Facial pain 2007   Left eye and left facial pain January 2007; evaluated by ophthalmologist Dr. Ishmael Holter; CT of face and orbits 09/16/2005 unremarkable; etiology unclear; improved by 10/28/2005.  . Fracture of left shoulder 2012  . Helicobacter pylori gastritis 11/16/2014   EGD done 11/01/2014 by Dr. Michail Sermon to work up weight loss showed gastritis; biopsy showed chronic active gastritis with Helicobacter pylori organisms.  Dr. Kathline Magic notes indicate plan to start treatment with Prevpac.   Marland Kitchen Rhinitis    Seasonal  . TB (tuberculosis) 1996   Pulmonary TB (right upper lobe infiltrate) diagnosed May 1996; treated at [facility]  . Tobacco abuse     Review of Systems: Pertinent positives mentioned in HPI. Remainder of all ROS negative.   Physical Exam:  Vitals:   02/11/17 1030  BP: (!) 154/90  Pulse: 97  Temp: 97.8 F (36.6 C)  TempSrc: Oral  SpO2: 100%  Weight: 98 lb (44.5 kg)  Height: 5' (1.524 m)   Physical Exam  Constitutional: He is oriented to person, place, and time. No distress.  HENT:  Head: Normocephalic and atraumatic.  Eyes: Right eye exhibits no discharge. Left eye exhibits no discharge.  Cardiovascular: Normal rate, regular rhythm and intact distal pulses.   Pulmonary/Chest: Effort normal and breath sounds normal. No respiratory  distress. He has no wheezes. He has no rales.  Abdominal: Soft. Bowel sounds are normal. He exhibits no distension. There is no tenderness.  Musculoskeletal:  Knees bilaterally: No erythema, increased warmth, or effusion. Normal range of motion. Crepitus present.  Neurological: He is alert and oriented to person, place, and time.  Skin: Skin is warm and dry.    Assessment & Plan:   See Encounters Tab for problem based charting.  Patient discussed with Dr. Daryll Drown

## 2017-02-11 NOTE — Assessment & Plan Note (Signed)
Assessment Patient has a history of moderate left knee osteoarthritis based on exam. No imaging in chart. He underwent arthrocentesis and a glucocorticoid injection was given in March 2018. Synovial fluid analysis at that time revealed no crystals and minimal white blood cells. He underwent repeat knee arthrocentesis on 02/04/2017. Synovial fluid analysis at this time again with no crystals and minimal white blood cells. He was advised to take ibuprofen and return to the clinic in 1 week for a follow-up. Patient reports feeling well at present. Denies having any knee pain or swelling. Denies having any fevers, chills, or any other joint pains. States he never took the ibuprofen that was prescribed to him. Does report having over-the-counter Aleve at home. No effusion noted on knee exam today. Patient has normal range of motion with crepitus bilaterally.  Plan -Advised him to take ibuprofen as needed if he experiences knee pain in the future -Advised him to return to the clinic if he notices any swelling in his knee.

## 2017-02-11 NOTE — Patient Instructions (Signed)
Mr. Kwong it was nice meeting you today.  I am glad your knee is doing much better now.  You may take ibuprofen as needed if you experience knee pain in the future.  Return to the clinic in 1 month for blood pressure recheck.

## 2017-02-11 NOTE — Assessment & Plan Note (Signed)
Assessment Current medication regimen includes hydrochlorothiazide 25 mg daily. Blood pressure was at goal during his prior 2 visits. Today blood pressure is 154/90. Patient states he has not taken his medication this morning.  Plan -Advised patient to go home and take his medication -Return to the clinic in 1 month for blood pressure recheck

## 2017-02-12 NOTE — Progress Notes (Signed)
Internal Medicine Clinic Attending  Case discussed with Dr. Rathoreat the time of the visit. We reviewed the resident's history and exam and pertinent patient test results. I agree with the assessment, diagnosis, and plan of care documented in the resident's note.  

## 2017-04-12 ENCOUNTER — Ambulatory Visit (INDEPENDENT_AMBULATORY_CARE_PROVIDER_SITE_OTHER): Payer: Medicare Other | Admitting: Student in an Organized Health Care Education/Training Program

## 2017-04-12 ENCOUNTER — Encounter: Payer: Self-pay | Admitting: Student in an Organized Health Care Education/Training Program

## 2017-04-12 VITALS — BP 131/89 | HR 100 | Temp 97.6°F | Wt 100.3 lb

## 2017-04-12 DIAGNOSIS — I10 Essential (primary) hypertension: Secondary | ICD-10-CM | POA: Diagnosis not present

## 2017-04-12 DIAGNOSIS — M1712 Unilateral primary osteoarthritis, left knee: Secondary | ICD-10-CM | POA: Diagnosis not present

## 2017-04-12 DIAGNOSIS — F1010 Alcohol abuse, uncomplicated: Secondary | ICD-10-CM

## 2017-04-12 DIAGNOSIS — E0580 Other thyrotoxicosis without thyrotoxic crisis or storm: Secondary | ICD-10-CM

## 2017-04-12 DIAGNOSIS — Z86010 Personal history of colonic polyps: Secondary | ICD-10-CM

## 2017-04-12 DIAGNOSIS — Z79899 Other long term (current) drug therapy: Secondary | ICD-10-CM | POA: Diagnosis not present

## 2017-04-12 DIAGNOSIS — Z87891 Personal history of nicotine dependence: Secondary | ICD-10-CM

## 2017-04-12 DIAGNOSIS — E0590 Thyrotoxicosis, unspecified without thyrotoxic crisis or storm: Secondary | ICD-10-CM | POA: Diagnosis not present

## 2017-04-12 DIAGNOSIS — K0889 Other specified disorders of teeth and supporting structures: Secondary | ICD-10-CM | POA: Diagnosis not present

## 2017-04-12 DIAGNOSIS — Z0000 Encounter for general adult medical examination without abnormal findings: Secondary | ICD-10-CM

## 2017-04-12 NOTE — Assessment & Plan Note (Signed)
PSA normal in 2016, next due around 2020. Colonoscopy has one 23m tubular adenoma in 2013. We are going to start annual FIT testing now for future colon cancer screening, test kit ordered today.

## 2017-04-12 NOTE — Assessment & Plan Note (Signed)
Clinical course of left knee pain most consistent with osteoarthritis, and I think he has a large component of patellofemoral syndrome as well. In March I did an arthrocentesis and removed 15 mLs of fluid and injected triamcinolone with modest benefit. Came back to clinic in May with a recurrence of left knee pain, the resident physician drained 7 mLs of fluid, did not inject steroids that time. Currently pain is well-controlled, exam is stable, plan is to continue to follow-up in the pain clinically and treat flares with either oral NSAIDs or arthrocentesis and injection.

## 2017-04-12 NOTE — Assessment & Plan Note (Signed)
Patient with mild alcohol use disorder which were concerned about for a while because he was having unexplained weight loss. He reports quitting alcohol about one week ago, mostly drinks high potency beer. I again offered him naltrexone today but he wants to try to remain abstinent without pharmacotherapy.

## 2017-04-12 NOTE — Assessment & Plan Note (Signed)
Blood pressure is well-controlled today, plan to continue with hydrochlorothiazide 25 mg daily.

## 2017-04-12 NOTE — Assessment & Plan Note (Signed)
TSH has been reduced with normal T3 and free T4 in the past. We have ruled him out for Graves' disease with a normal radioactive iodine and uptake scan. He does have a diffusely enlarged thyroid most consistent with goiter. I plan is to check thyroid function studies annually, and we've ordered TSH and free T4 today. Given his other comorbidities I would prefer to wait until he becomes overtly hyperthyroid before starting methimazole.

## 2017-04-12 NOTE — Patient Instructions (Signed)
1. Keep up the great work, if you need help stopping beer, please call me and we can try a medicine which can help reduce the cravings.   2. I will check your thyroid and kidney function today with a blood test.   3. I want you to do a stool test at home instead of a colonoscopy. Please send it back to [place] when you can.

## 2017-04-12 NOTE — Progress Notes (Signed)
   Assessment and Plan:  See Encounters tab for problem-based medical decision making.   __________________________________________________________  HPI:   56 year old man here for follow-up of hypertension. Patient reports doing well at home. He is compliant with his medications without adverse side effects. He lives by himself, goes to visit with his mother and other family every day. He reports that his left knee pain is currently stable and not bothering him. He quit drinking beer about one week ago, his girlfriend recently died and she had asked him several times to try to quit drinking. Patient feels motivated now to quit drinking. He is not seeking help from Alcoholics Anonymous and has consistently declined pharmacotherapy in the past. Denies feeling any sensation of withdrawals. He is eating and drinking well. He reports feeling like his weight and appetite are stable. Denies tobacco use.  __________________________________________________________  Problem List: Patient Active Problem List   Diagnosis Date Noted  . Loss of weight 05/23/2014    Priority: High  . Hypertension 09/21/2008    Priority: High  . Osteoarthritis of left knee 11/23/2016    Priority: Medium  . MGUS (monoclonal gammopathy of unknown significance) 03/09/2016    Priority: Medium  . Subclinical hyperthyroidism 10/04/2015    Priority: Medium  . Hyperlipidemia 06/17/2015    Priority: Medium  . Alcohol Use Disorder 06/17/2015    Priority: Medium  . Preventative health care 08/19/2011    Priority: Low    Medications: Reconciled today in Epic __________________________________________________________  Physical Exam:  Vital Signs: Vitals:   04/12/17 0941  BP: 131/89  Pulse: 100  Temp: 97.6 F (36.4 C)  TempSrc: Oral  Weight: 100 lb 4.8 oz (45.5 kg)    Gen: Well appearing, NAD ENT: OP clear without erythema or exudate, poor dentition Neck: No cervical LAD, diffuse thyromegaly with no  nodules. CV: tachycardic, no murmurs Pulm: Normal effort, CTA throughout, no wheezing Abd: Soft, NT, ND, normal BS.  Ext: Warm, no edema Skin: No atypical appearing moles. No rashes

## 2017-04-13 ENCOUNTER — Encounter: Payer: Self-pay | Admitting: Student in an Organized Health Care Education/Training Program

## 2017-04-13 LAB — BMP8+ANION GAP
ANION GAP: 15 mmol/L (ref 10.0–18.0)
BUN/Creatinine Ratio: 7 — ABNORMAL LOW (ref 9–20)
BUN: 4 mg/dL — AB (ref 6–24)
CO2: 22 mmol/L (ref 20–29)
CREATININE: 0.58 mg/dL — AB (ref 0.76–1.27)
Calcium: 9.7 mg/dL (ref 8.7–10.2)
Chloride: 106 mmol/L (ref 96–106)
GFR calc Af Amer: 131 mL/min/{1.73_m2} (ref >59)
GFR calc non Af Amer: 113 mL/min/{1.73_m2} (ref >59)
Glucose: 93 mg/dL (ref 65–99)
Potassium: 4.2 mmol/L (ref 3.5–5.2)
SODIUM: 143 mmol/L (ref 134–144)

## 2017-04-13 LAB — TSH: TSH: 0.39 u[IU]/mL — AB (ref 0.450–4.500)

## 2017-04-13 LAB — T4, FREE: FREE T4: 1.5 ng/dL (ref 0.82–1.77)

## 2017-04-21 DIAGNOSIS — Z0000 Encounter for general adult medical examination without abnormal findings: Secondary | ICD-10-CM | POA: Diagnosis not present

## 2017-04-27 ENCOUNTER — Other Ambulatory Visit: Payer: Medicare Other

## 2017-04-27 DIAGNOSIS — Z0000 Encounter for general adult medical examination without abnormal findings: Secondary | ICD-10-CM

## 2017-04-29 ENCOUNTER — Ambulatory Visit (INDEPENDENT_AMBULATORY_CARE_PROVIDER_SITE_OTHER): Payer: Medicare Other | Admitting: Internal Medicine

## 2017-04-29 ENCOUNTER — Encounter: Payer: Self-pay | Admitting: Internal Medicine

## 2017-04-29 VITALS — BP 133/77 | HR 88 | Temp 97.8°F | Wt 99.8 lb

## 2017-04-29 DIAGNOSIS — M1712 Unilateral primary osteoarthritis, left knee: Secondary | ICD-10-CM

## 2017-04-29 DIAGNOSIS — E7800 Pure hypercholesterolemia, unspecified: Secondary | ICD-10-CM

## 2017-04-29 DIAGNOSIS — Z79899 Other long term (current) drug therapy: Secondary | ICD-10-CM

## 2017-04-29 DIAGNOSIS — E785 Hyperlipidemia, unspecified: Secondary | ICD-10-CM | POA: Diagnosis not present

## 2017-04-29 DIAGNOSIS — I10 Essential (primary) hypertension: Secondary | ICD-10-CM | POA: Diagnosis not present

## 2017-04-29 DIAGNOSIS — M222X2 Patellofemoral disorders, left knee: Secondary | ICD-10-CM | POA: Diagnosis not present

## 2017-04-29 DIAGNOSIS — M25462 Effusion, left knee: Secondary | ICD-10-CM

## 2017-04-29 DIAGNOSIS — Z87891 Personal history of nicotine dependence: Secondary | ICD-10-CM

## 2017-04-29 MED ORDER — HYDROCHLOROTHIAZIDE 25 MG PO TABS: 25.0000 mg | ORAL_TABLET | Freq: Every day | ORAL | 3 refills | Status: DC

## 2017-04-29 MED ORDER — PRAVASTATIN SODIUM 20 MG PO TABS: 20.0000 mg | ORAL_TABLET | Freq: Every evening | ORAL | 3 refills | Status: DC

## 2017-04-29 MED ORDER — IBUPROFEN 800 MG PO TABS: 800.0000 mg | ORAL_TABLET | Freq: Three times a day (TID) | ORAL | 0 refills | Status: DC | PRN

## 2017-04-29 NOTE — Progress Notes (Signed)
   CC: Chronic left knee pain  HPI:  [name] is a 56 y.o. male with a past medical history of conditions listed below presenting to the clinic to discuss his chronic left knee pain. He is also requesting refills on medications.  Past Medical History:  Diagnosis Date  . Adenomatous colon polyp 12/14/2011  . Dermatitis    H/O Tinea dermatitis  . Elevated BP   . Elevated transaminase level    Mild elevation.  . Facial pain 2007   Left eye and left facial pain January 2007; evaluated by ophthalmologist Dr. Ishmael Holter; CT of face and orbits 09/16/2005 unremarkable; etiology unclear; improved by 10/28/2005.  . Fracture of left shoulder 2012  . Helicobacter pylori gastritis 11/16/2014   EGD done 11/01/2014 by Dr. Michail Sermon to work up weight loss showed gastritis; biopsy showed chronic active gastritis with Helicobacter pylori organisms.  Dr. Kathline Magic notes indicate plan to start treatment with Prevpac.   Marland Kitchen Rhinitis    Seasonal  . TB (tuberculosis) 1996   Pulmonary TB (right upper lobe infiltrate) diagnosed May 1996; treated at [facility]  . Tobacco abuse    Review of Systems: Pertinent positives mentioned in HPI. Remainder of all ROS negative.   Physical Exam:  Vitals:   04/29/17 0941  BP: 133/77  Pulse: 88  Temp: 97.8 F (36.6 C)  TempSrc: Oral  SpO2: 100%  Weight: 99 lb 12.8 oz (45.3 kg)   Physical Exam  Constitutional: He is oriented to person, place, and time. He appears well-developed and well-nourished. No distress.  HENT:  Mouth/Throat: Oropharynx is clear and moist.  Eyes: Right eye exhibits no discharge. Left eye exhibits no discharge.  Cardiovascular: Normal rate, regular rhythm and intact distal pulses.   Pulmonary/Chest: Effort normal and breath sounds normal. No respiratory distress. He has no wheezes. He has no rales.  Abdominal: Soft. Bowel sounds are normal. He exhibits no distension. There is no tenderness.  Musculoskeletal: He  exhibits no edema.  Knees bilaterally: Crepitus present. No erythema, increased warmth, or effusion noted.  Neurological: He is alert and oriented to person, place, and time.  Skin: Skin is warm and dry.    Assessment & Plan:   See Encounters Tab for problem based charting.  Patient discussed with Dr. Angelia Mould

## 2017-04-29 NOTE — Patient Instructions (Addendum)
Gregory Hernandez it was nice seeing you today.  -You have received a steroid injection in your left knee today.  -Please call the clinic immediately if you experice worsening pain in the knee, swelling, redness, or develop any fevers or chills.

## 2017-04-29 NOTE — Assessment & Plan Note (Signed)
Currently on pravastatin 20 mg daily indefinitely for primary prevention of ischemic vascular disease. Patient is requesting a refill.  Plan -Refill pravastatin

## 2017-04-29 NOTE — Assessment & Plan Note (Addendum)
Currently on hydrochlorothiazide 25 mg daily. Blood pressure continues to be well-controlled at this visit. Patient is requesting a refill on hydrochlorothiazide. BMP done 04/12/2017 showing normal renal function and electrolyte status.  Plan -Medication has been refilled

## 2017-04-29 NOTE — Assessment & Plan Note (Signed)
Patient has chronic left knee pain secondary to osteoarthritis and a component of patellofemoral syndrome. He was previously advised to take ibuprofen 800 mg every 8 hours as needed. Patient reports taking 2 tablets of over-the-counter Aleve daily instead. I discussed referring him to physical therapy but he declined. Patient has received a steroid injection in the past and stated it helped improve his pain and function. He was interested in getting another steroid injection at this visit.  -Advised him to take ibuprofen 800 mg every 8 hours as needed instead of over-the-counter Aleve  Knee Corticosteroid Injection Procedure Note  Diagnosis: left knee osteoarthritis   Indications: pain and decreased function   Procedure Details   Consent was obtained for the procedure. The joint was prepped with Betadine. 1 ml of kenalog-40 was drawn up into a 5 mL syringe with 2 mL of 1% lidocaine without epinephrine. The patient was injected with a 25-gauze needle at the medial aspect of his left flexed knee. The needle was removed and the area cleansed and dressed.  Complications:  None; patient tolerated the procedure well.  Return precautions given.

## 2017-05-02 NOTE — Progress Notes (Signed)
Internal Medicine Clinic Attending  I saw and evaluated the patient.  I personally confirmed the key portions of the history and exam documented by Dr. Marlowe Sax and I reviewed pertinent patient test results.  The assessment, diagnosis, and plan were formulated together and I agree with the documentation in the resident's note. I was present for the entire procedure, there were no complications.

## 2017-05-03 LAB — FECAL OCCULT BLOOD, IMMUNOCHEMICAL: FECAL OCCULT BLD: NEGATIVE

## 2017-05-04 ENCOUNTER — Encounter: Payer: Self-pay | Admitting: Student in an Organized Health Care Education/Training Program

## 2017-05-24 ENCOUNTER — Ambulatory Visit (HOSPITAL_COMMUNITY)
Admission: RE | Admit: 2017-05-24 | Discharge: 2017-05-24 | Disposition: A | Discharge status: Home / Self Care | Payer: Medicare Other | Source: Ambulatory Visit | Attending: Student in an Organized Health Care Education/Training Program | Admitting: Student in an Organized Health Care Education/Training Program

## 2017-05-24 ENCOUNTER — Encounter: Payer: Self-pay | Admitting: Student in an Organized Health Care Education/Training Program

## 2017-05-24 ENCOUNTER — Other Ambulatory Visit: Payer: Self-pay | Admitting: Student in an Organized Health Care Education/Training Program

## 2017-05-24 ENCOUNTER — Ambulatory Visit (INDEPENDENT_AMBULATORY_CARE_PROVIDER_SITE_OTHER): Payer: Medicare Other | Admitting: Student in an Organized Health Care Education/Training Program

## 2017-05-24 VITALS — BP 130/80 | HR 80 | Temp 97.7°F | Ht 60.0 in | Wt 104.4 lb

## 2017-05-24 DIAGNOSIS — I10 Essential (primary) hypertension: Secondary | ICD-10-CM | POA: Diagnosis not present

## 2017-05-24 DIAGNOSIS — M25462 Effusion, left knee: Secondary | ICD-10-CM | POA: Diagnosis not present

## 2017-05-24 DIAGNOSIS — Z87891 Personal history of nicotine dependence: Secondary | ICD-10-CM | POA: Diagnosis not present

## 2017-05-24 DIAGNOSIS — M170 Bilateral primary osteoarthritis of knee: Secondary | ICD-10-CM

## 2017-05-24 DIAGNOSIS — M1712 Unilateral primary osteoarthritis, left knee: Secondary | ICD-10-CM | POA: Diagnosis not present

## 2017-05-24 DIAGNOSIS — Z79899 Other long term (current) drug therapy: Secondary | ICD-10-CM

## 2017-05-24 DIAGNOSIS — M25461 Effusion, right knee: Secondary | ICD-10-CM | POA: Diagnosis not present

## 2017-05-24 DIAGNOSIS — M1711 Unilateral primary osteoarthritis, right knee: Secondary | ICD-10-CM | POA: Diagnosis not present

## 2017-05-24 DIAGNOSIS — Z23 Encounter for immunization: Secondary | ICD-10-CM

## 2017-05-24 DIAGNOSIS — M179 Osteoarthritis of knee, unspecified: Secondary | ICD-10-CM | POA: Diagnosis not present

## 2017-05-24 NOTE — Assessment & Plan Note (Signed)
Blood pressure well controlled today on recheck. Continue with HCTZ therapy.

## 2017-05-24 NOTE — Assessment & Plan Note (Signed)
Bilateral knee pain flare, left worse than right, for several days. Weight bearing xrays today show that he has mild bilateral medial tibiofemoral joint arthritis. POCUS shows very small effusions on both knees. Without significant effusion, and with recent steroid injection in August, we will not do injection therapy today. Rather, I advised NSAID therapy with Ibuprofen 600mg  BID for several days. He has no other sequela of inflammatory arthritis, but we could check auto-antibodies in the future if this vague intermittent poly-articular pain continues to flare.

## 2017-05-24 NOTE — Patient Instructions (Signed)
1. We are going to take xrays of your knees to look for arthritis.   2. Continue to use Ibuprofen up to 3 times a day for your joint pain. Hopefully this will get better over the next few days, call me if the pain does not get any better by next week.

## 2017-05-24 NOTE — Progress Notes (Signed)
   Assessment and Plan:  See Encounters tab for problem-based medical decision making.   __________________________________________________________  HPI:   56 year old man here for an acute visit for a flare of bilateral knee pain. The patient has intermittent knee pain, which we have treated with arthrocentesis on the left several times this year. He often accumulates fairly large effusions which we are able to drain and provide him symptomatic relief, no crystals ever identified. He had a steroid injection to the left knee in August by Dr. Marlowe Sax, but reports only minimal benefit that time. Now he is saying both knees have been hurting for several days, keeps him up at night. Also having stiffness in bilateral hands and shoulders. This is not function limiting currently. No swelling of joints, no skin changes. No fevers. Still able to walk just fine. Only taking 200mg  of ibuprofen daily with no benefit.  __________________________________________________________  Problem List: Patient Active Problem List   Diagnosis Date Noted  . Loss of weight 05/23/2014    Priority: High  . Hypertension 09/21/2008    Priority: High  . Osteoarthritis of left knee 11/23/2016    Priority: Medium  . MGUS (monoclonal gammopathy of unknown significance) 03/09/2016    Priority: Medium  . Subclinical hyperthyroidism 10/04/2015    Priority: Medium  . Hyperlipidemia 06/17/2015    Priority: Medium  . Alcohol Use Disorder 06/17/2015    Priority: Medium  . Preventative health care 08/19/2011    Priority: Low    Medications: Reconciled today in Epic __________________________________________________________  Physical Exam:  Vital Signs: Vitals:   05/24/17 0920 05/24/17 0934  BP: (!) 153/89 130/80  Pulse: 81 80  Temp: 97.7 F (36.5 C)   TempSrc: Oral   SpO2: 100%   Weight: 104 lb 6.4 oz (47.4 kg)   Height: 5' (1.524 m)     Gen: Well appearing, NAD Abd: Soft, NT, ND, normal BS.  Ext: Warm,  no edema, bilateral knees with mild crepitus, no joint laxity, no joint effusion, no errythema or warmth of the knees. Skin: No atypical appearing moles. No rashes  Ultrasound: bilateral knees have minimal effusions in the suprapatellar pouches, no bakers cyst on the left.

## 2017-07-12 ENCOUNTER — Ambulatory Visit (INDEPENDENT_AMBULATORY_CARE_PROVIDER_SITE_OTHER): Payer: Medicare Other | Admitting: Internal Medicine

## 2017-07-12 DIAGNOSIS — M25562 Pain in left knee: Secondary | ICD-10-CM | POA: Diagnosis present

## 2017-07-12 DIAGNOSIS — M1712 Unilateral primary osteoarthritis, left knee: Secondary | ICD-10-CM

## 2017-07-12 DIAGNOSIS — M170 Bilateral primary osteoarthritis of knee: Secondary | ICD-10-CM | POA: Diagnosis not present

## 2017-07-12 DIAGNOSIS — Z87891 Personal history of nicotine dependence: Secondary | ICD-10-CM | POA: Diagnosis not present

## 2017-07-12 NOTE — Progress Notes (Addendum)
   CC: knee pain  HPI:  Mr.Reid H Theall is a 56 y.o. presents today for left sided knee pain for several months intermittently. States that both knees will hurt at times. Pain is worse at night and in the morning. Made worse by lying down, better when walking. Patient has tried Ibuprofen and tylenol which help minimally. 9-10 in severity. Previously diagnosed with tibiofemoral joint osteoarthritis with bilateral effusions s/p corticosteroid injection.  Patient denied fever, chills, nausea, vomiting, diarrhea, constipation, dysuria, hematuria, muscle aches or chest. Endorsed swelling "every now and then" in both knees. He points primarily the left when asked. Mild coughing with sputum production, thick mucus substance.   Past Medical History:  Diagnosis Date  . Adenomatous colon polyp 12/14/2011  . Dermatitis    H/O Tinea dermatitis  . Elevated BP   . Elevated transaminase level    Mild elevation.  . Facial pain 2007   Left eye and left facial pain January 2007; evaluated by ophthalmologist Dr. Ishmael Holter; CT of face and orbits 09/16/2005 unremarkable; etiology unclear; improved by 10/28/2005.  . Fracture of left shoulder 2012  . Helicobacter pylori gastritis 11/16/2014   EGD done 11/01/2014 by Dr. Michail Sermon to work up weight loss showed gastritis; biopsy showed chronic active gastritis with Helicobacter pylori organisms.  Dr. Kathline Magic notes indicate plan to start treatment with Prevpac.   Marland Kitchen Rhinitis    Seasonal  . TB (tuberculosis) 1996   Pulmonary TB (right upper lobe infiltrate) diagnosed May 1996; treated at [facility]  . Tobacco abuse    Review of Systems:  ROS negative except as per HPI.  Physical Exam:  Vitals:   07/12/17 0921  BP: (!) 144/86  Pulse: 85  Temp: 98 F (36.7 C)  TempSrc: Oral  SpO2: 100%  Weight: 98 lb (44.5 kg)  Height: 5' (1.524 m)   Physical Exam  Constitutional: He appears well-developed and well-nourished. No distress.  Nursing  note and vitals reviewed.  Knee Arthrocentesis with Injection Procedure Note  Diagnosis: left osteoarthritis of the tibiofemoral joint  Indications: Symptom relief from osteoarthritis  Anesthesia: Lidocaine 1% without epinephrine  Procedure Details   Anatomical landmarks were used to identify and plan needle trajectory and depth for the injection. Consent was obtained for the procedure. The joint was prepped with Betadine. A 22 gauge needle was inserted into the medio lateral aspect of the joint to access the joint space. 0 ml of clear yellow fluid was removed from the joint. 1.5 ml 1% lidocaine and 1 ml of Triamcinolone was then injected into the joint. The needle was removed and the area cleansed and dressed.  Complications:  None; patient tolerated the procedure well.   Assessment & Plan:   See Encounters Tab for problem based charting.  Patient seen with Dr. Daryll Drown

## 2017-07-12 NOTE — Assessment & Plan Note (Signed)
Assessment: Left knee pain, not worse but persisting from his previous visit with Dr. Evette Doffing on 05/24/2017. Patient would like injection today given the severity of the pain at night. He has opted for a left knee injection over the right as the left is more severe.  Plan:  -continue ibuprofen 600mg  QHS -injection of 72ml kenalog and 1.5 lidocaine into the left knee via a medial unguided approach -continue heat as needed for pain -return if needed at anytime

## 2017-07-12 NOTE — Patient Instructions (Signed)
Given the chronicity of your knee pain and diagnosis of osteoarthritis we injected a corticosteroid and lidocaine into the join for symptomatic relief of the pain.  You may continue to utilize ibuprofen 600mg  prior to bed if needed for pain. Also, you may continue to use heating pads as needed if helpful.  Thank you for your visit to the [facility] today.

## 2017-07-14 NOTE — Progress Notes (Signed)
Internal Medicine Clinic Attending  I saw and evaluated the patient.  I personally confirmed the key portions of the history and exam documented by Dr. Berline Lopes and I reviewed pertinent patient test results.  The assessment, diagnosis, and plan were formulated together and I agree with the documentation in the resident's note.  I was present for the entire procedure.

## 2017-11-22 ENCOUNTER — Ambulatory Visit (INDEPENDENT_AMBULATORY_CARE_PROVIDER_SITE_OTHER): Payer: Medicare Other | Admitting: Student in an Organized Health Care Education/Training Program

## 2017-11-22 ENCOUNTER — Ambulatory Visit (HOSPITAL_COMMUNITY)
Admission: RE | Admit: 2017-11-22 | Discharge: 2017-11-22 | Disposition: A | Discharge status: Home / Self Care | Payer: Medicare Other | Source: Ambulatory Visit | Attending: Student in an Organized Health Care Education/Training Program | Admitting: Student in an Organized Health Care Education/Training Program

## 2017-11-22 ENCOUNTER — Encounter: Payer: Self-pay | Admitting: Student in an Organized Health Care Education/Training Program

## 2017-11-22 ENCOUNTER — Telehealth: Payer: Self-pay | Admitting: Student in an Organized Health Care Education/Training Program

## 2017-11-22 ENCOUNTER — Other Ambulatory Visit: Payer: Self-pay | Admitting: Student in an Organized Health Care Education/Training Program

## 2017-11-22 VITALS — BP 113/84 | HR 100 | Temp 98.2°F | Wt 97.1 lb

## 2017-11-22 DIAGNOSIS — F1099 Alcohol use, unspecified with unspecified alcohol-induced disorder: Secondary | ICD-10-CM | POA: Diagnosis not present

## 2017-11-22 DIAGNOSIS — I10 Essential (primary) hypertension: Secondary | ICD-10-CM

## 2017-11-22 DIAGNOSIS — F1010 Alcohol abuse, uncomplicated: Secondary | ICD-10-CM

## 2017-11-22 DIAGNOSIS — N62 Hypertrophy of breast: Secondary | ICD-10-CM | POA: Diagnosis not present

## 2017-11-22 DIAGNOSIS — W228XXA Striking against or struck by other objects, initial encounter: Secondary | ICD-10-CM | POA: Diagnosis not present

## 2017-11-22 DIAGNOSIS — Z79899 Other long term (current) drug therapy: Secondary | ICD-10-CM

## 2017-11-22 DIAGNOSIS — S2232XA Fracture of one rib, left side, initial encounter for closed fracture: Secondary | ICD-10-CM

## 2017-11-22 DIAGNOSIS — D472 Monoclonal gammopathy: Secondary | ICD-10-CM | POA: Diagnosis not present

## 2017-11-22 DIAGNOSIS — Z87891 Personal history of nicotine dependence: Secondary | ICD-10-CM

## 2017-11-22 DIAGNOSIS — X58XXXA Exposure to other specified factors, initial encounter: Secondary | ICD-10-CM | POA: Diagnosis not present

## 2017-11-22 DIAGNOSIS — S2242XA Multiple fractures of ribs, left side, initial encounter for closed fracture: Secondary | ICD-10-CM | POA: Diagnosis not present

## 2017-11-22 DIAGNOSIS — R9389 Abnormal findings on diagnostic imaging of other specified body structures: Secondary | ICD-10-CM | POA: Diagnosis not present

## 2017-11-22 NOTE — Progress Notes (Signed)
   Assessment and Plan:  See Encounters tab for problem-based medical decision making.   __________________________________________________________  HPI:   57 year old man here for routine follow-up of hypertension.  He reports doing well at home.  He is independent in all his activities of daily living, lives close by with family.  He has a history of alcohol use disorder causing unintentional weight loss and generally of poor functional status.  He continues to drink 1 tall beers daily, says that he has cut back a lot.  He has mild hypertension and takes hydrochlorothiazide daily.  He denies any adverse side effects and reports good compliance.  He has one acute complaint today, he was getting up from bed to use the bathroom and says he bumped into a table.  He has had persistent left sided chest pain since that time.  He points to about the 10th rib just anterior of the mid axillary line by the costal margin.  He says his pain is well controlled, does not keep him up at night, is just a little annoying.  No fevers or chills.  Breathing well with no dyspnea on exertion.  No other recent illnesses, hospitalizations, or urgent care visits.  __________________________________________________________  Problem List: Patient Active Problem List   Diagnosis Date Noted  . Loss of weight 05/23/2014    Priority: High  . Hypertension 09/21/2008    Priority: High  . Osteoarthritis of left knee 11/23/2016    Priority: Medium  . MGUS (monoclonal gammopathy of unknown significance) 03/09/2016    Priority: Medium  . Subclinical hyperthyroidism 10/04/2015    Priority: Medium  . Hyperlipidemia 06/17/2015    Priority: Medium  . Alcohol Use Disorder 06/17/2015    Priority: Medium  . Preventative health care 08/19/2011    Priority: Low  . Left rib fracture 11/22/2017    Medications: Reconciled today in Epic __________________________________________________________  Physical Exam:  Vital  Signs: Vitals:   11/22/17 0901  BP: 113/84  Pulse: 100  Temp: 98.2 F (36.8 C)  TempSrc: Oral    Gen: Well appearing, NAD, thin Neck: No cervical LAD, No thyromegaly or nodules, No JVD. CV: Regular tachycardic, no murmurs, mild gynecomastia  Pulm: Normal effort, CTA throughout, no wheezing Abd: Soft, NT, ND.   Ext: Warm, no edema, normal joints Skin: No atypical appearing moles. No rashes

## 2017-11-22 NOTE — Assessment & Plan Note (Signed)
Stable right now, continues to drink 1 "tall beer" daily.  He is at risk for developing cirrhosis in the future, already exhibits mild gynecomastia today.  No organomegaly on exam.  We will plan to check liver function in 6 months.  Patient is not ready for complete abstinence yet or naltrexone therapy.

## 2017-11-22 NOTE — Assessment & Plan Note (Signed)
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  Otherwise normal CBC and renal function.  Will check serum IFE and light chains today for annual  monitoring.

## 2017-11-22 NOTE — Assessment & Plan Note (Signed)
Blood pressure well controlled.  Continue with hydrochlorothiazide 25 mg once daily.  We will check renal function in 6 months.

## 2017-11-22 NOTE — Assessment & Plan Note (Signed)
1 week of left-sided rib pain after bumping into a table.  On point-of-care ultrasound it looks like there is a small rib fracture at the left lower anterior rib.  I am going to get a left-sided rib x-ray to confirm.  No signs of pneumothorax or pleural effusion.  Pain is well controlled, I advised NSAIDs.  Anticipate this will heal nicely with more time.

## 2017-11-22 NOTE — Patient Instructions (Signed)

## 2017-11-22 NOTE — Telephone Encounter (Signed)
I spoke with patient about xray result, confirms left sided 9th and 10th rib fractures. He understands, agrees that pain is well controlled right now. I advised Ibuprofen, non-surgical management, call if worsening symptoms. He understands.   I am going to think about DEXA scan in his case. To me this says higher than normal fracture risk, he may have osteoporosis due to low BMI and alcohol use. Unfortunately DEXA scans in men are notoriously difficult to get approved through insurance.

## 2017-11-24 LAB — KAPPA/LAMBDA LIGHT CHAINS
IG KAPPA FREE LIGHT CHAIN: 21.3 mg/L — AB (ref 3.3–19.4)
IG LAMBDA FREE LIGHT CHAIN: 10.6 mg/L (ref 5.7–26.3)
Kappa/Lambda FluidC Ratio: 2.01 — ABNORMAL HIGH (ref 0.26–1.65)

## 2017-11-24 LAB — IMMUNOFIXATION, SERUM
IGG (IMMUNOGLOBIN G), SERUM: 1409 mg/dL (ref 700–1600)
IgA/Immunoglobulin A, Serum: 330 mg/dL (ref 90–386)
IgM (Immunoglobulin M), Srm: 67 mg/dL (ref 20–172)

## 2018-06-13 ENCOUNTER — Ambulatory Visit (INDEPENDENT_AMBULATORY_CARE_PROVIDER_SITE_OTHER): Payer: Medicare Other | Admitting: Student in an Organized Health Care Education/Training Program

## 2018-06-13 ENCOUNTER — Encounter: Payer: Self-pay | Admitting: Student in an Organized Health Care Education/Training Program

## 2018-06-13 VITALS — BP 116/84 | HR 89 | Temp 98.7°F | Wt 96.3 lb

## 2018-06-13 DIAGNOSIS — F1010 Alcohol abuse, uncomplicated: Secondary | ICD-10-CM | POA: Diagnosis not present

## 2018-06-13 DIAGNOSIS — Z87311 Personal history of (healed) other pathological fracture: Secondary | ICD-10-CM

## 2018-06-13 DIAGNOSIS — N62 Hypertrophy of breast: Secondary | ICD-10-CM | POA: Diagnosis not present

## 2018-06-13 DIAGNOSIS — Z79899 Other long term (current) drug therapy: Secondary | ICD-10-CM | POA: Diagnosis not present

## 2018-06-13 DIAGNOSIS — E0510 Thyrotoxicosis with toxic single thyroid nodule without thyrotoxic crisis or storm: Secondary | ICD-10-CM | POA: Diagnosis not present

## 2018-06-13 DIAGNOSIS — Z87891 Personal history of nicotine dependence: Secondary | ICD-10-CM

## 2018-06-13 DIAGNOSIS — I10 Essential (primary) hypertension: Secondary | ICD-10-CM | POA: Diagnosis present

## 2018-06-13 DIAGNOSIS — Z23 Encounter for immunization: Secondary | ICD-10-CM | POA: Diagnosis not present

## 2018-06-13 DIAGNOSIS — E0590 Thyrotoxicosis, unspecified without thyrotoxic crisis or storm: Secondary | ICD-10-CM | POA: Diagnosis not present

## 2018-06-13 DIAGNOSIS — E041 Nontoxic single thyroid nodule: Secondary | ICD-10-CM

## 2018-06-13 NOTE — Assessment & Plan Note (Signed)
Blood pressure well controlled.  Continue hydrochlorothiazide 25 mg once daily.  Check renal function today.

## 2018-06-13 NOTE — Assessment & Plan Note (Signed)
Patient with a history of subclinical hyperthyroidism and a mildly enlarged thyroid.  My exam today I notes that his left thyroid has about a 1 cm firm nodule.  No other adenopathy.  Plan is to order a ultrasound of the thyroid to better characterize.  We will also check a TSH today.

## 2018-06-13 NOTE — Progress Notes (Signed)
   Assessment and Plan:  See Encounters tab for problem-based medical decision making.   __________________________________________________________  HPI:   57 year old man here for follow-up of hypertension.  Reports doing well.  Reports good compliance with his medications with no side effects.  Says he is doing okay at home.  His mother continues to worry about his alcohol use.  Says he drank a tall beers yesterday, usually has 1/day.  He is low weight, which has been stable around 96-100 pounds.  He says his weights are up and down.  Is a little frustrated by it because everyone seems to be worrying about it.  No fevers or chills.  He had a left rib fracture at her last visit which is healed on its own.  No residual pain.  Says he stays active, likes to participate in his church.  __________________________________________________________  Problem List: Patient Active Problem List   Diagnosis Date Noted  . Loss of weight 05/23/2014    Priority: High  . Hypertension 09/21/2008    Priority: High  . Osteoarthritis of left knee 11/23/2016    Priority: Medium  . MGUS (monoclonal gammopathy of unknown significance) 03/09/2016    Priority: Medium  . Subclinical hyperthyroidism 10/04/2015    Priority: Medium  . Hyperlipidemia 06/17/2015    Priority: Medium  . Alcohol Use Disorder 06/17/2015    Priority: Medium  . Preventative health care 08/19/2011    Priority: Low  . Thyroid nodule 06/13/2018    Medications: Reconciled today in Epic __________________________________________________________  Physical Exam:  Vital Signs: Vitals:   06/13/18 0935  BP: 116/84  Pulse: 89  Temp: 98.7 F (37.1 C)  TempSrc: Oral  SpO2: 100%    Gen: Well appearing, NAD Neck: No cervical LAD, mildly enlarged thyroid, left thyroid is a 1 cm mass, nonmobile. CV: RRR, no murmurs, gynecomastia on his chest Pulm: Normal effort, CTA throughout, no wheezing Abd: Soft, NT, ND, no  hepatosplenomegaly Ext: Warm, no edema, normal joints Skin: No atypical appearing moles. No rashes

## 2018-06-13 NOTE — Assessment & Plan Note (Signed)
Continues to use alcohol, having some early signs of cirrhosis.  He has gynecomastia on exam.  No organomegaly on his abdominal exam.  Plan is to check liver enzymes and platelets today.

## 2018-06-13 NOTE — Patient Instructions (Signed)
Continue taking her medicines as prescribed.  I am going to order an ultrasound of your neck because you have a small nodule on your left thyroid.  This can be done in the coming few weeks.  I will call you with the results.

## 2018-06-13 NOTE — Assessment & Plan Note (Signed)
Symptomatically stable.  Checking TSH and free T4 today.  Thyroid nodule also noted, see plan for that problem.

## 2018-06-14 ENCOUNTER — Encounter: Payer: Self-pay | Admitting: Student in an Organized Health Care Education/Training Program

## 2018-06-14 LAB — CBC
HEMATOCRIT: 44.4 % (ref 37.5–51.0)
HEMOGLOBIN: 14.9 g/dL (ref 13.0–17.7)
MCH: 29.9 pg (ref 26.6–33.0)
MCHC: 33.6 g/dL (ref 31.5–35.7)
MCV: 89 fL (ref 79–97)
Platelets: 256 10*3/uL (ref 150–450)
RBC: 4.98 x10E6/uL (ref 4.14–5.80)
RDW: 14.4 % (ref 12.3–15.4)
WBC: 3.6 10*3/uL (ref 3.4–10.8)

## 2018-06-14 LAB — CMP14 + ANION GAP
ALBUMIN: 4.8 g/dL (ref 3.5–5.5)
ALK PHOS: 94 IU/L (ref 39–117)
ALT: 26 IU/L (ref 0–44)
ANION GAP: 16 mmol/L (ref 10.0–18.0)
AST: 31 IU/L (ref 0–40)
Albumin/Globulin Ratio: 1.5 (ref 1.2–2.2)
BILIRUBIN TOTAL: 0.8 mg/dL (ref 0.0–1.2)
BUN / CREAT RATIO: 10 (ref 9–20)
BUN: 7 mg/dL (ref 6–24)
CHLORIDE: 101 mmol/L (ref 96–106)
CO2: 25 mmol/L (ref 20–29)
CREATININE: 0.67 mg/dL — AB (ref 0.76–1.27)
Calcium: 10.2 mg/dL (ref 8.7–10.2)
GFR calc Af Amer: 122 mL/min/{1.73_m2} (ref >59)
GFR calc non Af Amer: 106 mL/min/{1.73_m2} (ref >59)
GLUCOSE: 81 mg/dL (ref 65–99)
Globulin, Total: 3.1 g/dL (ref 1.5–4.5)
Potassium: 4.3 mmol/L (ref 3.5–5.2)
Sodium: 142 mmol/L (ref 134–144)
Total Protein: 7.9 g/dL (ref 6.0–8.5)

## 2018-06-14 LAB — T4, FREE: Free T4: 1.2 ng/dL (ref 0.82–1.77)

## 2018-06-14 LAB — TSH: TSH: 0.291 u[IU]/mL — AB (ref 0.450–4.500)

## 2018-06-15 ENCOUNTER — Telehealth: Payer: Self-pay

## 2018-06-15 NOTE — Telephone Encounter (Signed)
error 

## 2018-07-01 ENCOUNTER — Ambulatory Visit (HOSPITAL_COMMUNITY)
Admission: RE | Admit: 2018-07-01 | Discharge: 2018-07-01 | Disposition: A | Discharge status: Home / Self Care | Payer: Medicare Other | Source: Ambulatory Visit | Attending: Student in an Organized Health Care Education/Training Program | Admitting: Student in an Organized Health Care Education/Training Program

## 2018-07-01 ENCOUNTER — Telehealth: Payer: Self-pay | Admitting: Student in an Organized Health Care Education/Training Program

## 2018-07-01 ENCOUNTER — Ambulatory Visit (HOSPITAL_COMMUNITY): Admission: RE | Admit: 2018-07-01 | Payer: Medicare Other | Source: Ambulatory Visit

## 2018-07-01 DIAGNOSIS — E041 Nontoxic single thyroid nodule: Secondary | ICD-10-CM

## 2018-07-01 DIAGNOSIS — E042 Nontoxic multinodular goiter: Secondary | ICD-10-CM | POA: Insufficient documentation

## 2018-07-01 NOTE — Telephone Encounter (Signed)
Patient with enlarged thyroid on exam. Ultrasound shows a 2.5cm nodule in the left lobe which is mildly suspicious and will need FNA biopsy, which I have ordered.   Gregory Hernandez, can you follow up with patient to ensure this gets scheduled? Thanks.

## 2018-07-04 ENCOUNTER — Encounter: Payer: Self-pay | Admitting: Student in an Organized Health Care Education/Training Program

## 2018-07-04 ENCOUNTER — Other Ambulatory Visit: Payer: Self-pay

## 2018-07-04 ENCOUNTER — Ambulatory Visit (INDEPENDENT_AMBULATORY_CARE_PROVIDER_SITE_OTHER): Payer: Medicare Other | Admitting: Student in an Organized Health Care Education/Training Program

## 2018-07-04 VITALS — BP 138/85 | HR 83 | Temp 98.7°F | Wt 99.7 lb

## 2018-07-04 DIAGNOSIS — Z87891 Personal history of nicotine dependence: Secondary | ICD-10-CM | POA: Diagnosis not present

## 2018-07-04 DIAGNOSIS — I10 Essential (primary) hypertension: Secondary | ICD-10-CM | POA: Diagnosis not present

## 2018-07-04 DIAGNOSIS — Z79899 Other long term (current) drug therapy: Secondary | ICD-10-CM | POA: Diagnosis not present

## 2018-07-04 DIAGNOSIS — E041 Nontoxic single thyroid nodule: Secondary | ICD-10-CM

## 2018-07-04 MED ORDER — PRAVASTATIN SODIUM 20 MG PO TABS: 20.0000 mg | ORAL_TABLET | Freq: Every evening | ORAL | 3 refills | Status: DC

## 2018-07-04 MED ORDER — CHLORTHALIDONE 25 MG PO TABS: 25.0000 mg | ORAL_TABLET | Freq: Every day | ORAL | 3 refills | Status: DC

## 2018-07-04 NOTE — Progress Notes (Signed)
   Assessment and Plan:  See Encounters tab for problem-based medical decision making.   __________________________________________________________  HPI:   57 year old man here for follow-up of thyroid nodule.  We noticed an enlarged thyroid at his last exam with some more fullness in the left side.  This was a discrete nodule.  Ultrasound returned with a 2-1/2 cm nodule in the left lobe of the thyroid.  It was mildly suspicious, TI-RADS 3.  We talked about the need for a biopsy of that nodule to rule out thyroid cancer.  Patient understands.  I answered his questions.  Also will plan for annual ultrasonography if it turned out to just be goiter changes.  He is otherwise doing well.  Taking his medications without side effects.  Asked me to refill his blood pressure medication today.  __________________________________________________________  Problem List: Patient Active Problem List   Diagnosis Date Noted  . Loss of weight 05/23/2014    Priority: High  . Hypertension 09/21/2008    Priority: High  . Osteoarthritis of left knee 11/23/2016    Priority: Medium  . MGUS (monoclonal gammopathy of unknown significance) 03/09/2016    Priority: Medium  . Subclinical hyperthyroidism 10/04/2015    Priority: Medium  . Hyperlipidemia 06/17/2015    Priority: Medium  . Alcohol Use Disorder 06/17/2015    Priority: Medium  . Preventative health care 08/19/2011    Priority: Low  . Thyroid nodule 06/13/2018    Medications: Reconciled today in Epic __________________________________________________________  Physical Exam:  Vital Signs: Vitals:   07/04/18 1127  BP: 138/85  Pulse: 83  Temp: 98.7 F (37.1 C)  TempSrc: Oral  SpO2: 99%  Weight: 99 lb 11.2 oz (45.2 kg)    Gen: Well appearing, NAD Neck: No cervical LAD, diffusely enlarged thyroid gland, 2 cm palpable nodule within the left thyroid lobe.. CV: RRR, no murmurs

## 2018-07-04 NOTE — Patient Instructions (Signed)
We are going to arrange for a biopsy of your thyroid gland.  Please call us if you do not hear anything from the schedulers.  I refilled your blood pressure medicine and cholesterol medicine to your pharmacy.  Take 1 pill each once a day.

## 2018-07-04 NOTE — Assessment & Plan Note (Signed)
Blood pressure little above goal today as systolic 825.  Plan to change hydrochlorothiazide to chlorthalidone 25 mg daily.

## 2018-07-04 NOTE — Assessment & Plan Note (Signed)
2-centimeter nodule in the left lobe of the thyroid, mildly suspicious, Ti-RADS 3 on ultrasound.  Good still does be goiter changes.  Plan for fine-needle aspiration biopsy with [facility] imaging.  If that is normal, will do ultrasound annually for monitoring.  I talked to the patient and answered his questions.

## 2018-07-20 DIAGNOSIS — H2513 Age-related nuclear cataract, bilateral: Secondary | ICD-10-CM | POA: Diagnosis not present

## 2018-07-21 NOTE — Telephone Encounter (Signed)
Appt Date/Time/Location   07/27/2018  3:30 PM  at [place]

## 2018-07-27 ENCOUNTER — Ambulatory Visit
Admission: RE | Admit: 2018-07-27 | Discharge: 2018-07-27 | Disposition: A | Discharge status: Home / Self Care | Payer: Medicare Other | Source: Ambulatory Visit | Attending: Student in an Organized Health Care Education/Training Program | Admitting: Student in an Organized Health Care Education/Training Program

## 2018-07-27 ENCOUNTER — Other Ambulatory Visit (HOSPITAL_COMMUNITY)
Admission: RE | Admit: 2018-07-27 | Discharge: 2018-07-27 | Disposition: A | Discharge status: Home / Self Care | Payer: Medicare Other | Source: Ambulatory Visit | Attending: Radiology | Admitting: Radiology

## 2018-07-27 DIAGNOSIS — E041 Nontoxic single thyroid nodule: Secondary | ICD-10-CM | POA: Insufficient documentation

## 2018-07-28 ENCOUNTER — Telehealth: Payer: Self-pay | Admitting: Student in an Organized Health Care Education/Training Program

## 2018-07-28 DIAGNOSIS — E041 Nontoxic single thyroid nodule: Secondary | ICD-10-CM

## 2018-07-28 NOTE — Telephone Encounter (Signed)
Called and gave results of thyroid biopsy, benign follicular nodule. Plan is to monitor his goiter with annual ultrasound. Patient understands.

## 2018-10-03 ENCOUNTER — Encounter: Payer: Self-pay | Admitting: *Deleted

## 2018-11-08 IMAGING — CR DG RIBS W/ CHEST 3+V*L*
4 series · 4 of 4 positions shown · non-contrast
Comparison: Chest x-ray of May 14, 2016.

CLINICAL DATA: Direct trauma to the left lower lateral ribcage
several days ago wrist with resultant pain when lying on the left
side.

EXAM:
LEFT RIBS AND CHEST - 3+ VIEW

[rib ap]
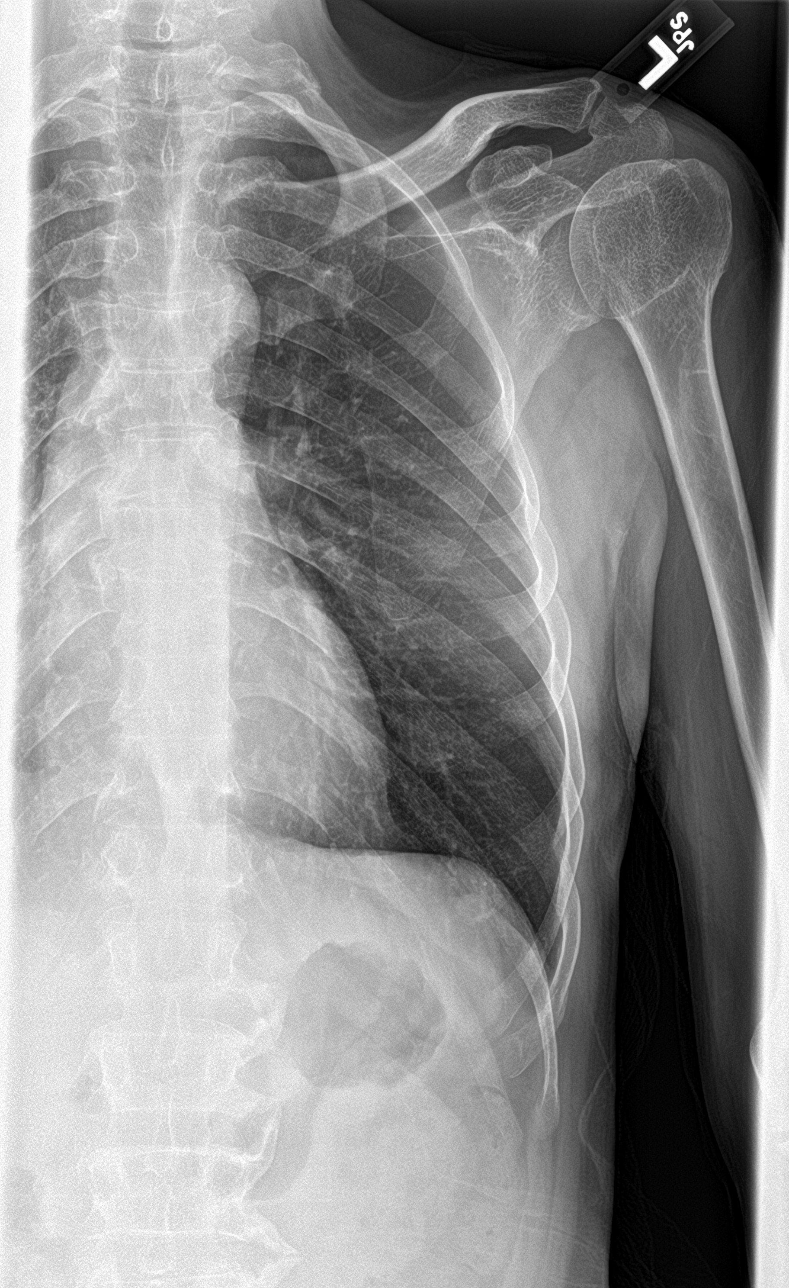

[rib ap obl]
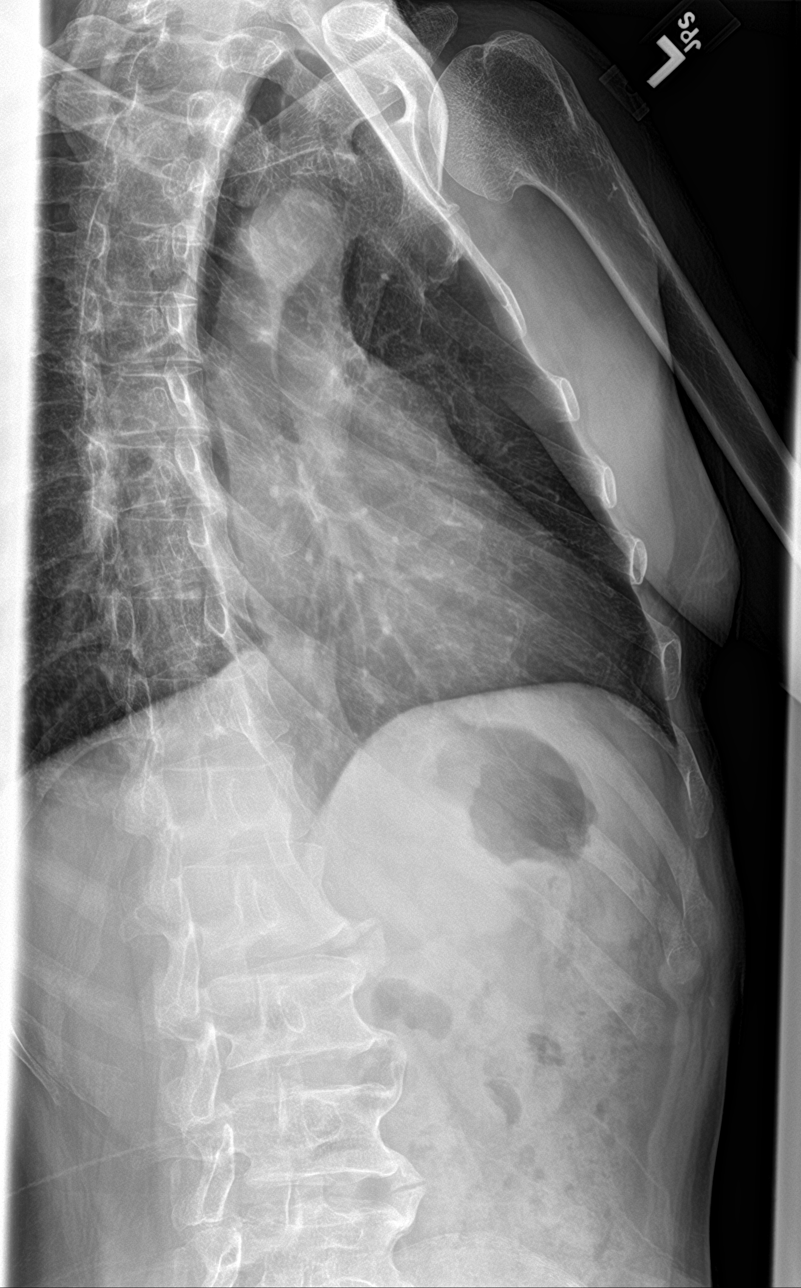

[chest pa]
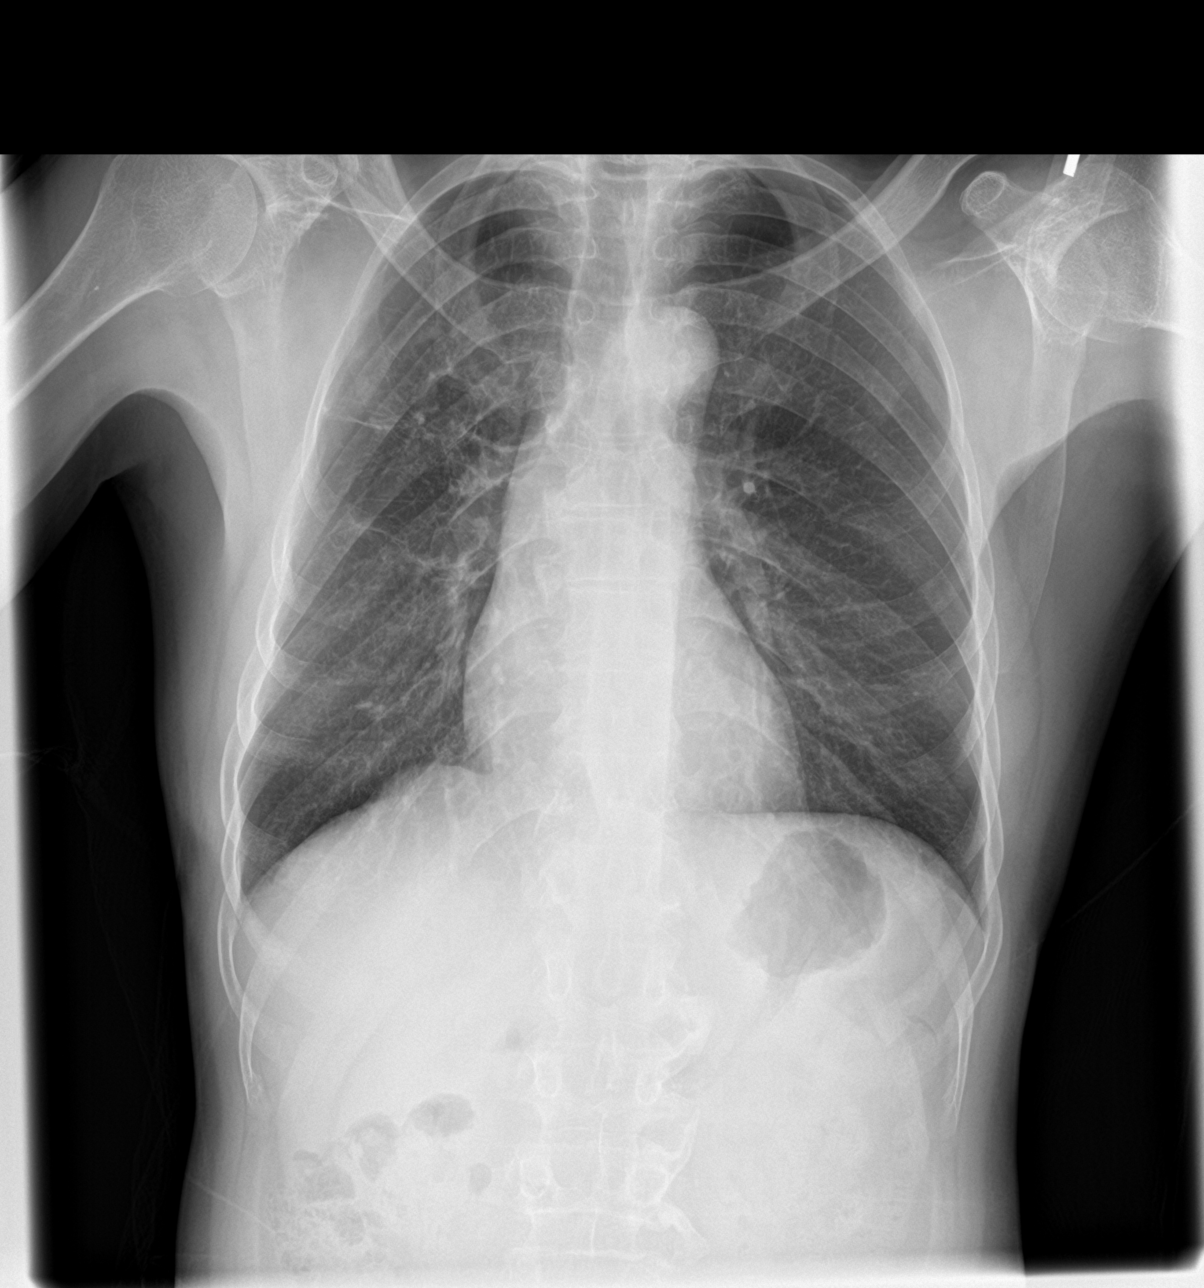

[rib pa obl]
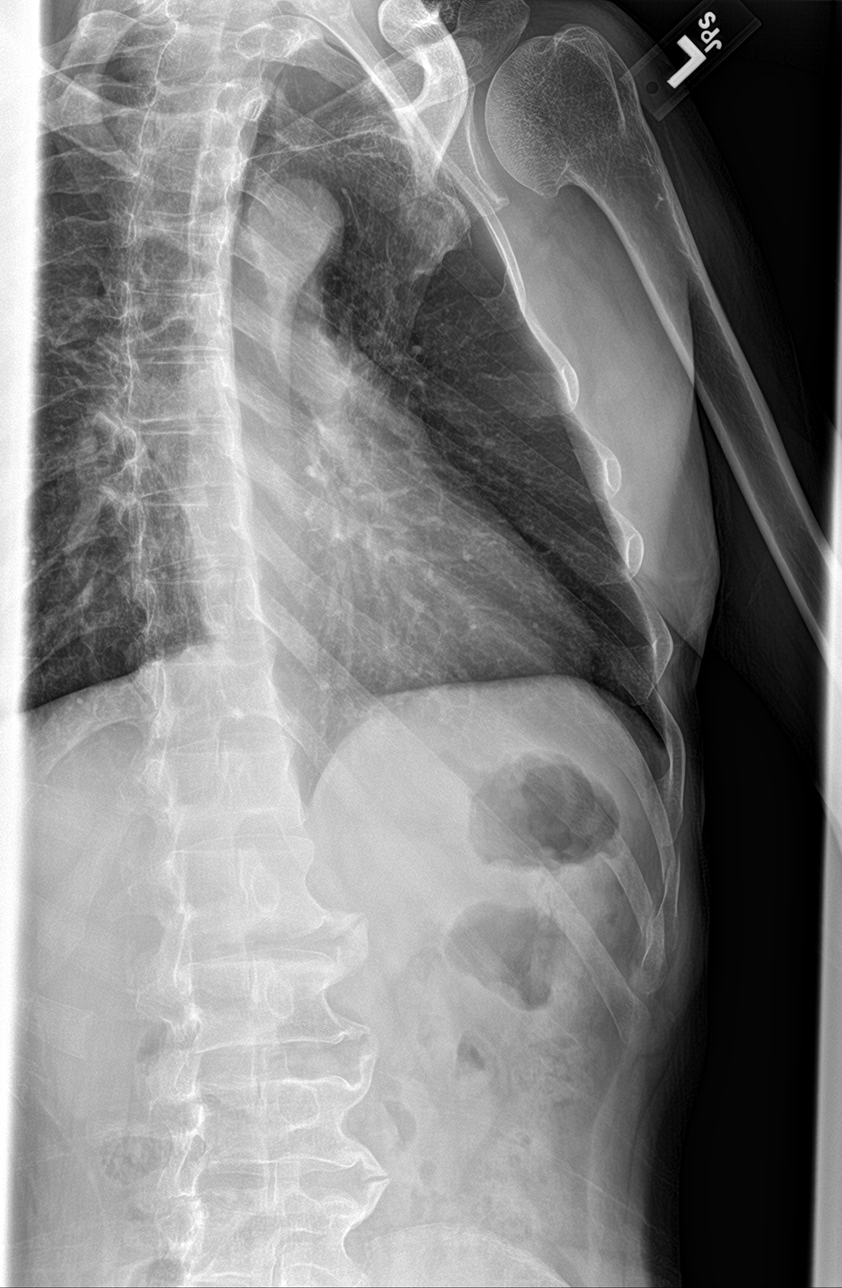

[4 of 4 positions shown; findings below may reference images not displayed]

FINDINGS: The lungs are adequately inflated. There is no focal infiltrate. The
interstitial markings are coarse though stable. The heart and
pulmonary vascularity are normal.

There are fractures of the anterior aspects of the left ninth and
tenth ribs. There is no pleural effusion or pneumothorax.
IMPRESSION: The patient has sustained acute minimally displaced fractures of the
anterior aspects of the left ninth and tenth ribs.

Underlying COPD or reactive airway disease.

## 2018-11-16 ENCOUNTER — Encounter: Payer: Self-pay | Admitting: Student in an Organized Health Care Education/Training Program

## 2018-11-24 ENCOUNTER — Telehealth: Payer: Self-pay | Admitting: Student in an Organized Health Care Education/Training Program

## 2018-11-24 NOTE — Telephone Encounter (Signed)
I left a voicemail for Gregory Hernandez saying that we will be rescheduling his routine follow-up appointment that was set for Monday.  Left instructions to get in touch with me if he has any questions.  Make an appointment for [facility] with any urgent needs.

## 2018-11-28 ENCOUNTER — Encounter: Payer: Self-pay | Admitting: Student in an Organized Health Care Education/Training Program

## 2019-02-20 ENCOUNTER — Other Ambulatory Visit: Payer: Self-pay

## 2019-02-20 ENCOUNTER — Ambulatory Visit (INDEPENDENT_AMBULATORY_CARE_PROVIDER_SITE_OTHER): Payer: Medicare HMO | Admitting: Student in an Organized Health Care Education/Training Program

## 2019-02-20 ENCOUNTER — Encounter: Payer: Self-pay | Admitting: Student in an Organized Health Care Education/Training Program

## 2019-02-20 VITALS — BP 120/82 | HR 84 | Temp 97.6°F | Wt 90.1 lb

## 2019-02-20 DIAGNOSIS — Z79899 Other long term (current) drug therapy: Secondary | ICD-10-CM

## 2019-02-20 DIAGNOSIS — I10 Essential (primary) hypertension: Secondary | ICD-10-CM | POA: Diagnosis not present

## 2019-02-20 DIAGNOSIS — Z7289 Other problems related to lifestyle: Secondary | ICD-10-CM

## 2019-02-20 DIAGNOSIS — E041 Nontoxic single thyroid nodule: Secondary | ICD-10-CM | POA: Diagnosis not present

## 2019-02-20 DIAGNOSIS — F1010 Alcohol abuse, uncomplicated: Secondary | ICD-10-CM

## 2019-02-20 DIAGNOSIS — Z1211 Encounter for screening for malignant neoplasm of colon: Secondary | ICD-10-CM

## 2019-02-20 MED ORDER — NALTREXONE HCL 50 MG PO TABS: 50.0000 mg | ORAL_TABLET | Freq: Every day | ORAL | 3 refills | Status: DC

## 2019-02-20 NOTE — Assessment & Plan Note (Signed)
Patient with multiple nodules in his thyroid, palpable left lobe nodule about 2.5 cm.  Had a fine-needle biopsy which showed [facility] 2 benign findings.  Plan is to repeat an ultrasound in October.  Also will likely repeat thyroid studies at that time as he has a history of subclinical hyperthyroidism.

## 2019-02-20 NOTE — Assessment & Plan Note (Signed)
Moderate alcohol use disorder.  No signs of cirrhosis on exam or prior lab work.  Currently drinking about 3 standard drinks per day.  He desires to cut back.  We again talked about naltrexone, we have talked about this in the past.  Tells me he is willing to try at this point.  We will start naltrexone 50 mg once daily.  Follow-up in 3-4 months to gauge response.

## 2019-02-20 NOTE — Assessment & Plan Note (Signed)
Blood pressure is well controlled.  Currently at goal.  Plan is to continue with chlorthalidone 25 mg daily.  Will check BMP at her next visit in October.

## 2019-02-20 NOTE — Progress Notes (Signed)
   Assessment and Plan:  See Encounters tab for problem-based medical decision making.   __________________________________________________________  HPI:   58 year old person here for follow-up of hypertension and a thyroid nodule be diagnosed last fall.  Reports doing very well at home.  He is compliant with physical distancing recommendations through the COVID-19 pandemic.  Has had no recent infectious symptoms.  Reports good exertional capacity, says that he is active, independent in all activities of daily living, get some exercise through walking and mowing the grass.  Reports good appetite, eats as much as he can.  We talked about his alcohol use, still drinking 1 tall beer daily at least.  Sometimes more.  Wants to cut back.  Says it is hard.  Has strong cravings for alcohol use.  In the past we will have wondered if this was contributing to his unintentional weight loss.  Patient showed no signs of chronic liver disease in the past.  Reports good compliance with his other medications without side effects.  No recent hospitalizations.  Lives independently with good family support.  __________________________________________________________  Problem List: Patient Active Problem List   Diagnosis Date Noted  . Thyroid nodule 06/13/2018    Priority: High  . Loss of weight 05/23/2014    Priority: High  . Hypertension 09/21/2008    Priority: High  . Osteoarthritis of left knee 11/23/2016    Priority: Medium  . MGUS (monoclonal gammopathy of unknown significance) 03/09/2016    Priority: Medium  . Subclinical hyperthyroidism 10/04/2015    Priority: Medium  . Hyperlipidemia 06/17/2015    Priority: Medium  . Alcohol Use Disorder 06/17/2015    Priority: Medium  . Preventative health care 08/19/2011    Priority: Low    Medications: Reconciled today in Epic __________________________________________________________  Physical Exam:  Vital Signs: Vitals:   02/20/19 0954  BP:  120/82  Pulse: 84  Temp: 97.6 F (36.4 C)  TempSrc: Oral  SpO2: 100%  Weight: 90 lb 1.6 oz (40.9 kg)    Gen: Well appearing, NAD Neck: No cervical LAD, mild diffuse thyromegaly palpable thyroid nodule at the left lobe, No JVD. CV: RRR, no murmurs Pulm: Normal effort, CTA throughout, no wheezing Abd: Soft, NT, ND MSK: Warm, no edema, normal joints Skin: No atypical appearing moles. No rashes

## 2019-02-20 NOTE — Patient Instructions (Signed)
Today we talked about your thyroid nodule.  We will do a repeat ultrasound in October to follow-up your thyroid gland.  Your blood pressure looks great today.  We will do repeat blood work in October.  We talked about your alcohol use.  I am glad that you are trying to cut back.  We can start a medication called naltrexone.  Take 1 tablet once a day.  This medicine will help you to use less alcohol.  Call me if you have any side effects or problems with this medicine.

## 2019-02-21 DIAGNOSIS — Z1211 Encounter for screening for malignant neoplasm of colon: Secondary | ICD-10-CM | POA: Diagnosis not present

## 2019-02-28 ENCOUNTER — Other Ambulatory Visit: Payer: Medicare HMO

## 2019-02-28 ENCOUNTER — Other Ambulatory Visit: Payer: Self-pay

## 2019-02-28 DIAGNOSIS — Z1211 Encounter for screening for malignant neoplasm of colon: Secondary | ICD-10-CM

## 2019-03-02 ENCOUNTER — Encounter: Payer: Self-pay | Admitting: Student in an Organized Health Care Education/Training Program

## 2019-03-02 LAB — FECAL OCCULT BLOOD, IMMUNOCHEMICAL: Fecal Occult Bld: NEGATIVE

## 2019-04-10 ENCOUNTER — Telehealth: Payer: Self-pay

## 2019-04-10 NOTE — Telephone Encounter (Signed)
Called pt/mother - no answer, "call cannot be completed at this time"; unable to leave a message.

## 2019-04-10 NOTE — Telephone Encounter (Signed)
Requesting to speak with a nurse about meds. Please call pt back.  

## 2019-05-08 ENCOUNTER — Telehealth: Payer: Self-pay | Admitting: *Deleted

## 2019-05-08 NOTE — Telephone Encounter (Signed)
Pt's mother calls very distraught over pt's alcoholism, she states he needs to be sent away for rehab, she wants an appt to find out if this can be done and what can be done to help him. appt given for [facility] wed 9/2 Sending to [place] also, to see if she can assist pt and/or mother.

## 2019-05-10 ENCOUNTER — Ambulatory Visit (INDEPENDENT_AMBULATORY_CARE_PROVIDER_SITE_OTHER): Payer: Medicare HMO | Admitting: Internal Medicine

## 2019-05-10 ENCOUNTER — Other Ambulatory Visit: Payer: Self-pay

## 2019-05-10 ENCOUNTER — Encounter: Payer: Self-pay | Admitting: Internal Medicine

## 2019-05-10 VITALS — BP 127/92 | HR 108 | Temp 97.7°F | Ht 60.0 in | Wt 85.8 lb

## 2019-05-10 DIAGNOSIS — Z7289 Other problems related to lifestyle: Secondary | ICD-10-CM

## 2019-05-10 DIAGNOSIS — Z681 Body mass index (BMI) 19 or less, adult: Secondary | ICD-10-CM | POA: Diagnosis not present

## 2019-05-10 DIAGNOSIS — H7392 Unspecified disorder of tympanic membrane, left ear: Secondary | ICD-10-CM | POA: Diagnosis not present

## 2019-05-10 DIAGNOSIS — F1010 Alcohol abuse, uncomplicated: Secondary | ICD-10-CM

## 2019-05-10 DIAGNOSIS — D472 Monoclonal gammopathy: Secondary | ICD-10-CM | POA: Diagnosis not present

## 2019-05-10 DIAGNOSIS — E785 Hyperlipidemia, unspecified: Secondary | ICD-10-CM

## 2019-05-10 DIAGNOSIS — E0580 Other thyrotoxicosis without thyrotoxic crisis or storm: Secondary | ICD-10-CM

## 2019-05-10 DIAGNOSIS — I10 Essential (primary) hypertension: Secondary | ICD-10-CM | POA: Diagnosis not present

## 2019-05-10 DIAGNOSIS — R634 Abnormal weight loss: Secondary | ICD-10-CM

## 2019-05-10 DIAGNOSIS — H938X1 Other specified disorders of right ear: Secondary | ICD-10-CM | POA: Insufficient documentation

## 2019-05-10 MED ORDER — NALTREXONE HCL 50 MG PO TABS: 50.0000 mg | ORAL_TABLET | Freq: Every day | ORAL | 0 refills | Status: DC

## 2019-05-10 MED ORDER — ENSURE HIGH PROTEIN PO POWD: 771.0000 g | Freq: Every day | ORAL | 1 refills | Status: DC

## 2019-05-10 NOTE — Assessment & Plan Note (Signed)
  Patient stated that his right ear is stuffed up for the past once day. He has not had any hearing difficulties. Denies any dizziness. Will monitor for progression.

## 2019-05-10 NOTE — Assessment & Plan Note (Signed)
The patient has had a significant weight loss over the past month. He currently weighs 85lbs.   Assessment and plan  Will check tsh, free t4 to check for acute thyroid changes causing weight loss. Will check cbc and cmp to assess for possible progression of mgus to multiple myeloma.   -placed nutrition referral for patient to get assistance on nutritional needs  -placed order for ensure supplements

## 2019-05-10 NOTE — Assessment & Plan Note (Signed)
Last alcohol use was one week ago during which time he had two 40oz of beer. He says that his consumptom of alcohol has significantly decreased. He is currently taking naltrexone 50mg  once a day and is adherent to this.   Assessment and plan   -refilled naltrexone

## 2019-05-10 NOTE — Telephone Encounter (Signed)
Pt seen today in [place].Despina Hidden Cass[place]9/2/20204:44 PM

## 2019-05-10 NOTE — Patient Instructions (Signed)
It was a pleasure to see you today [name]. Please make the following changes:  -I have refilled your naltrexone  -I have placed a nutrition consult for you to help with your weight loss  -I have prescribed ensure protein shakes  -We got labs today and I will call you with results.  -Please follow up in 4 weeks   If you have any questions or concerns, please call our clinic at [phone] between 9am-5pm and after hours call [phone] and ask for the internal medicine resident on call. If you feel you are having a medical emergency please call 911.   Thank you, we look forward to help you remain healthy!  Lars Mage, MD Internal Medicine PGY3

## 2019-05-10 NOTE — Progress Notes (Signed)
   CC: Management of alcohol use disorder  HPI:  Mr.Mandeep H Stebbins is a 58 y.o. with htn, aud, hld, mgus, subclinical hyperthyroidism who presented for management of alcohol use disorder. Please see problem based charting for evaluation, assessment, and plan.  Past Medical History:  Diagnosis Date  . Adenomatous colon polyp 12/14/2011  . Dermatitis    H/O Tinea dermatitis  . Elevated BP   . Elevated transaminase level    Mild elevation.  . Facial pain 2007   Left eye and left facial pain January 2007; evaluated by ophthalmologist Dr. Ishmael Holter; CT of face and orbits 09/16/2005 unremarkable; etiology unclear; improved by 10/28/2005.  . Fracture of left shoulder 2012  . Helicobacter pylori gastritis 11/16/2014   EGD done 11/01/2014 by Dr. Michail Sermon to work up weight loss showed gastritis; biopsy showed chronic active gastritis with Helicobacter pylori organisms.  Dr. Kathline Magic notes indicate plan to start treatment with Prevpac.   Marland Kitchen Rhinitis    Seasonal  . TB (tuberculosis) 1996   Pulmonary TB (right upper lobe infiltrate) diagnosed May 1996; treated at [facility]  . Tobacco abuse    Review of Systems:    Review of Systems  Constitutional: Negative for chills and fever.  Respiratory: Negative for shortness of breath.   Cardiovascular: Negative for chest pain.  Gastrointestinal: Negative for abdominal pain, nausea and vomiting.  Neurological: Negative for dizziness and headaches.   Physical Exam:  Vitals:   05/10/19 1348  BP: (!) 127/92  Pulse: (!) 108  Temp: 97.7 F (36.5 C)  TempSrc: Oral  SpO2: 100%  Weight: 85 lb 12.8 oz (38.9 kg)  Height: 5' (1.524 m)   Physical Exam  Constitutional: He is oriented to person, place, and time. He appears well-developed and well-nourished. No distress.  HENT:  Head: Normocephalic and atraumatic.  Right Ear: External ear normal. No drainage, swelling or tenderness. No decreased hearing is noted.  Left Ear: External  ear normal. No drainage, swelling or tenderness. No decreased hearing is noted.  Left ear with a small white speck on TM  Cardiovascular: Normal rate, regular rhythm and normal heart sounds.  Respiratory: Effort normal and breath sounds normal. No respiratory distress. He has no wheezes.  GI: Soft. Bowel sounds are normal. He exhibits no distension. There is no abdominal tenderness.  Neurological: He is alert and oriented to person, place, and time.  Garbled speech  Skin: He is not diaphoretic.  Psychiatric: He has a normal mood and affect. His behavior is normal. Judgment and thought content normal.   Assessment & Plan:   See Encounters Tab for problem based charting.  Patient Seen with Dr. Rebeca Alert

## 2019-05-11 LAB — CMP14 + ANION GAP
ALT: 73 IU/L — ABNORMAL HIGH (ref 0–44)
AST: 55 IU/L — ABNORMAL HIGH (ref 0–40)
Albumin/Globulin Ratio: 1.4 (ref 1.2–2.2)
Albumin: 5.1 g/dL — ABNORMAL HIGH (ref 3.8–4.9)
Alkaline Phosphatase: 110 IU/L (ref 39–117)
Anion Gap: 20 mmol/L — ABNORMAL HIGH (ref 10.0–18.0)
BUN/Creatinine Ratio: 17 (ref 9–20)
BUN: 13 mg/dL (ref 6–24)
Bilirubin Total: 0.7 mg/dL (ref 0.0–1.2)
CO2: 26 mmol/L (ref 20–29)
Calcium: 11.6 mg/dL — ABNORMAL HIGH (ref 8.7–10.2)
Chloride: 87 mmol/L — ABNORMAL LOW (ref 96–106)
Creatinine, Ser: 0.75 mg/dL — ABNORMAL LOW (ref 0.76–1.27)
GFR calc Af Amer: 116 mL/min/{1.73_m2} (ref >59)
GFR calc non Af Amer: 100 mL/min/{1.73_m2} (ref >59)
Globulin, Total: 3.6 g/dL (ref 1.5–4.5)
Glucose: 104 mg/dL — ABNORMAL HIGH (ref 65–99)
Potassium: 3.7 mmol/L (ref 3.5–5.2)
Sodium: 133 mmol/L — ABNORMAL LOW (ref 134–144)
Total Protein: 8.7 g/dL — ABNORMAL HIGH (ref 6.0–8.5)

## 2019-05-11 LAB — CBC
Hematocrit: 48.1 % (ref 37.5–51.0)
Hemoglobin: 15.9 g/dL (ref 13.0–17.7)
MCH: 29 pg (ref 26.6–33.0)
MCHC: 33.1 g/dL (ref 31.5–35.7)
MCV: 88 fL (ref 79–97)
Platelets: 256 10*3/uL (ref 150–450)
RBC: 5.49 x10E6/uL (ref 4.14–5.80)
RDW: 12.9 % (ref 11.6–15.4)
WBC: 4.4 10*3/uL (ref 3.4–10.8)

## 2019-05-11 LAB — T4, FREE: Free T4: 1.48 ng/dL (ref 0.82–1.77)

## 2019-05-11 LAB — TSH: TSH: 0.271 u[IU]/mL — ABNORMAL LOW (ref 0.450–4.500)

## 2019-05-16 NOTE — Progress Notes (Signed)
Internal Medicine Clinic Attending  I saw and evaluated the patient.  I personally confirmed the key portions of the history and exam documented by Dr. Maricela Bo and I reviewed pertinent patient test results.  The assessment, diagnosis, and plan were formulated together and I agree with the documentation in the resident's note.  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   Lenice Pressman, M.D., Ph.D.

## 2019-05-16 NOTE — Addendum Note (Signed)
Addended by: Lars Mage on: 05/16/2019 03:12 PM   Modules accepted: Orders

## 2019-05-19 ENCOUNTER — Other Ambulatory Visit: Payer: Self-pay

## 2019-05-19 ENCOUNTER — Encounter: Payer: Self-pay | Admitting: Dietician

## 2019-05-19 ENCOUNTER — Ambulatory Visit: Payer: Medicare HMO | Admitting: Dietician

## 2019-05-19 NOTE — Patient Instructions (Addendum)
You are doing great at gaining weight.   Your goal is 110# than is a good weight for you.  Keep drinking nutritional l supplements twice a day as you have been- consider [employer] brand or [employer] brand to save you money.   Other Suggestions:  Buy and drink soy milk or lactose free milk instead of soda Buy and drink 100% orange juice instead of soda Buy fruit and eat as dessert and snacks  instead  Of chips- eat 1/2 peanut butter sandwich on whole grain bread- feel free to put a banana on it or raisins  Eat chicken, fish and [place] more than beef or pork   Tuna fish salad sandwich  Egg salad sandwich  Potatoes and sweet potatoes are low cost good starches and easy to keep and prepare    Butch Penny [phone]

## 2019-05-19 NOTE — Progress Notes (Signed)
Documentation:   05/19/2019 Gregory Hernandez [id number]  Assessment:  Primary concerns today: wants to gain weight ( his goal is ~110#) Gregory Hernandez says his weight loss was due to drinking alcohol and now that he is no longer drinking, he is eating better. He lives alone and does his own food preparation. His mom helps with food shopping at end of month. They share 200$ in food stamps.  ANTHROPOMETRICS: Estimated body mass index is 17.5 kg/m as calculated from the following:   Height as of 05/10/19: 5' (1.524 m).   Weight as of this encounter: 89 lb 9.6 oz (40.6 kg).  WEIGHT HISTORY: 110# usual and last year Wt Readings from Last 5 Encounters:  05/19/19 89 lb 9.6 oz (40.6 kg)  05/10/19 85 lb 12.8 oz (38.9 kg)  02/20/19 90 lb 1.6 oz (40.9 kg)  07/04/18 99 lb 11.2 oz (45.2 kg)  06/13/18 96 lb 4.8 oz (43.7 kg)    SLEEP:good- bedtime 11pm- 10 am, gets up 3 tiems to use the bathroom  MEDICATIONS: one a day for men and those listed on his medication list in his chart DIETARY INTAKE: Usual eating pattern includes 3 meals and 3 snacks per day.  Everyday foods include.Chicken, pig feet, pork chop, hamburger, spaghetti in the can, sandwich meat. Hot dogs, bologna, cheese, bread, ensure 2x/day Tea buys a jugs. Soda, 2-3 2 liters per months, milk sends him to the bathroom  He reports no problems with chewing swallowing,(Brushes and flosses daily) constipation or diarrhea and has a bowel movement daily   Avoided foods/Food Intolerances: cheese and milk, denies hair loss, problems chewing swallowing Dining Out (times/week): 1x/week, [place], [employer] 24-hr recall:  B (10-11 AM): eggs, sausage, grits, toast, sweet tea Snk ( AM): pringles can  L ( 12 PM):  Bologna sandwich x1, must and mayo, sweet tea Snk ( PM): pringles,skins D (6-7 PM): 1 can macaroni and beef , mixed vegetables, tea Snk ( PM): skins, soda Beverages: fruit punch, tea and soda, water with medicine  Usual physical activity: watch  tv, go to mother's house, walks  Estimated daily energy needs for weight gain: ~1250-1750 calories   Intervention:  Nutrition education about ways to increase nutrient density with same amount of food cost Action Goal:100% juice and soy milk instead of soda, fruit and nuts for snacks instead of chips  Outcome goal: weight gain and increased nutritional status Coordination of care:   Demonstrated degree of understanding via:  Teach Back   Monitoring/Evaluation progress monthly Debera Lat, RD 05/19/2019 2:14 PM.

## 2019-06-26 ENCOUNTER — Ambulatory Visit: Payer: Medicare HMO | Admitting: Dietician

## 2019-06-26 ENCOUNTER — Encounter: Payer: Self-pay | Admitting: Student in an Organized Health Care Education/Training Program

## 2019-06-26 ENCOUNTER — Ambulatory Visit (INDEPENDENT_AMBULATORY_CARE_PROVIDER_SITE_OTHER): Payer: Medicare HMO | Admitting: Student in an Organized Health Care Education/Training Program

## 2019-06-26 ENCOUNTER — Other Ambulatory Visit: Payer: Self-pay

## 2019-06-26 ENCOUNTER — Ambulatory Visit: Payer: Medicare HMO | Admitting: Student in an Organized Health Care Education/Training Program

## 2019-06-26 VITALS — BP 119/72 | HR 100 | Temp 97.8°F | Ht 60.0 in | Wt 86.9 lb

## 2019-06-26 DIAGNOSIS — Z7289 Other problems related to lifestyle: Secondary | ICD-10-CM

## 2019-06-26 DIAGNOSIS — E041 Nontoxic single thyroid nodule: Secondary | ICD-10-CM

## 2019-06-26 DIAGNOSIS — D472 Monoclonal gammopathy: Secondary | ICD-10-CM

## 2019-06-26 DIAGNOSIS — Z681 Body mass index (BMI) 19 or less, adult: Secondary | ICD-10-CM | POA: Diagnosis not present

## 2019-06-26 DIAGNOSIS — Z23 Encounter for immunization: Secondary | ICD-10-CM

## 2019-06-26 DIAGNOSIS — F1010 Alcohol abuse, uncomplicated: Secondary | ICD-10-CM

## 2019-06-26 DIAGNOSIS — R634 Abnormal weight loss: Secondary | ICD-10-CM | POA: Diagnosis not present

## 2019-06-26 DIAGNOSIS — I10 Essential (primary) hypertension: Secondary | ICD-10-CM | POA: Diagnosis not present

## 2019-06-26 DIAGNOSIS — Z79899 Other long term (current) drug therapy: Secondary | ICD-10-CM | POA: Diagnosis not present

## 2019-06-26 NOTE — Progress Notes (Signed)
Documentation: Gregory Hernandez lost 3# in the past months decreasing his BMI to Estimated body mass index is 16.97 kg/m as calculated from the following:   Height as of an earlier encounter on 06/26/19: 5' (1.524 m).   Weight as of an earlier encounter on 06/26/19: 86 lb 14.4 oz (39.4 kg).  Wt Readings from Last 5 Encounters:  06/26/19 86 lb 14.4 oz (39.4 kg)  05/19/19 89 lb 9.6 oz (40.6 kg)  05/10/19 85 lb 12.8 oz (38.9 kg)  02/20/19 90 lb 1.6 oz (40.9 kg)  07/04/18 99 lb 11.2 oz (45.2 kg)   He reports drinking 1 beer per day and also an ensure daily. He again reports having plenty of food at home and no problem with having sufficient foods. He did not indicate that he is eating fruit or using 100% fruit juice, but he stated that he is drinking whole milk. He was provided with 7 carnation instant breakfast packs with coupons and 6 boost high protein today. He drank one in the office without any problem.  Action goal- drink one carnation instant breakfast or boost each day with ensure. Bing medicine and vitamins to next visit.  Outcome goals- weight gain (goal weight 110# per patient)and increased nutritional status Debera Lat, RD 06/26/2019 10:44 AM.

## 2019-06-26 NOTE — Patient Instructions (Signed)
Please drink a "Milkshake" every day.   Can use Boost or the [employer] powder in whole milk.   Bring your medicine and vitamins in November so we can see what you are taking.   Thank you!  Butch Penny [phone]

## 2019-06-26 NOTE — Assessment & Plan Note (Signed)
Unintentional weight loss over the last few months.  Weight is down 15 pounds over the last 1 year, down 3 pounds over the last 1 month.  Current weight 86 pounds, BMI slightly under 70.  Serum protein and albumin levels are slightly above the upper limit of normal.  Sounds like he is getting adequate nutrition with at least 2 meals per day.  He is doing a good job reducing alcohol use.  He got no complaints to suggest dysphagia.  No signs of malignancy on exam.  Plan is to work-up the hypercalcemia seen at last visit, which could be a sign of underlying malignancy.  Continue with naltrexone for alcohol use disorder.  Will follow this closely.  I do not detect any organomegaly on my exam, but will have low threshold for CT imaging if we can find an alternative explanation for the weight loss.

## 2019-06-26 NOTE — Assessment & Plan Note (Signed)
Hypercalcemia with a serum calcium of 11.6 on labs from last month.  Has not had hypercalcemia in the past.  He is on a thiazide diuretic, has been for greater than 6 years.  Will recheck serum calcium today, along with a PTH and vitamin D level.  If PTH is suppressed, will discontinue the thiazide diuretic and recheck.  No other signs that this is a malignant hypercalcemia besides the unintentional weight loss, we are evaluating for malignancy as well.

## 2019-06-26 NOTE — Progress Notes (Signed)
   Assessment and Plan:  See Encounters tab for problem-based medical decision making.   __________________________________________________________  HPI:   58 year old man here for follow-up of unintentional weight loss.  He has a history of alcohol use disorder, long history of being at low weight, he has a monoclonal gammopathy of unknown significance, he has a left-sided thyroid nodules that we have recently biopsied which were Bethesda category 2 benign, he has hypertension well controlled on thiazide diuretic for over 6 years.  He was seen in the clinic about 1 month ago by a colleague who got lab work which was mostly reassuring.  Only thing new was a mild hypercalcemia with a level of 11.6, all other calcium checks have been normal to that point.  There was a mild transaminitis, this was likely due to overindulgence with alcohol recently.  He reports good functional and exertional capacity.  Reports good access to nutrition.  He eats about 2 meals per day consistently, sometimes to me.  He has family support through his mother.  He reports alcohol use has been down, only drink 1 day last week.  Good compliance with naltrexone.  Denies any pain with swallowing, no vomiting, no food aversion, no early satiety.  No fevers, no chills, no night sweats.  He is a longtime non-smoker, no cough or hemoptysis.  Denies any back pain or other bone pain.  Denies any lower urinary tract symptoms.  Denies any other lumps or bumps or skin lesions.  __________________________________________________________  Problem List: Patient Active Problem List   Diagnosis Date Noted  . Thyroid nodule 06/13/2018    Priority: High  . Loss of weight 05/23/2014    Priority: High  . Hypertension 09/21/2008    Priority: High  . Osteoarthritis of left knee 11/23/2016    Priority: Medium  . MGUS (monoclonal gammopathy of unknown significance) 03/09/2016    Priority: Medium  . Subclinical hyperthyroidism 10/04/2015   Priority: Medium  . Hyperlipidemia 06/17/2015    Priority: Medium  . Alcohol Use Disorder 06/17/2015    Priority: Medium  . Preventative health care 08/19/2011    Priority: Low  . Hypercalcemia 06/26/2019  . Ear fullness, right 05/10/2019    Medications: Reconciled today in Epic __________________________________________________________  Physical Exam:  Vital Signs: Vitals:   06/26/19 0931  BP: 119/72  Pulse: 100  Temp: 97.8 F (36.6 C)  TempSrc: Oral  SpO2: 100%  Weight: 86 lb 14.4 oz (39.4 kg)  Height: 5' (1.524 m)    Gen: Well-appearing, thin man, no distress Neck: No cervical LAD, moderate thyromegaly with an enlarged left lobe that is known to me CV: RRR, no murmurs Pulm: Normal effort, CTA throughout, no wheezing Abd: Soft, NT, ND, no hepato or splenomegaly Ext: Warm, no edema, normal joints Skin: No atypical appearing moles. No rashes

## 2019-06-26 NOTE — Assessment & Plan Note (Addendum)
History of an IgG kappa MGUS.  Slight elevation in calcium last month.  We will recheck serum protein electrophoresis, immunofixation, and light chains today.

## 2019-06-26 NOTE — Patient Instructions (Addendum)
It was great seeing you today in the clinic.  We talked about your weight loss over the last few months.  It seems like you are getting good nutrition and doing a good job reducing your alcohol use.  I am going to check some blood work today to look for reasons for your weight loss.  I will call you towards the end of the week with the results.  Your blood pressure is in a good range, please continue with the medicines that you are prescribed.

## 2019-06-26 NOTE — Assessment & Plan Note (Signed)
Alcohol use disorder seems to be under good control on naltrexone 50 mg daily.  Only drink 1 day out of last week.  Plan to continue with this medication.  Can check his liver function again today as he had a mild transaminitis last month.  No signs of cirrhosis on exam.

## 2019-06-28 ENCOUNTER — Encounter: Payer: Self-pay | Admitting: Student in an Organized Health Care Education/Training Program

## 2019-06-28 LAB — CMP14 + ANION GAP
ALT: 24 IU/L (ref 0–44)
AST: 40 IU/L (ref 0–40)
Albumin/Globulin Ratio: 1.6 (ref 1.2–2.2)
Albumin: 4.3 g/dL (ref 3.8–4.9)
Alkaline Phosphatase: 107 IU/L (ref 39–117)
Anion Gap: 17 mmol/L (ref 10.0–18.0)
BUN/Creatinine Ratio: 7 — ABNORMAL LOW (ref 9–20)
BUN: 5 mg/dL — ABNORMAL LOW (ref 6–24)
Bilirubin Total: 0.5 mg/dL (ref 0.0–1.2)
CO2: 21 mmol/L (ref 20–29)
Calcium: 10.2 mg/dL (ref 8.7–10.2)
Chloride: 103 mmol/L (ref 96–106)
Creatinine, Ser: 0.72 mg/dL — ABNORMAL LOW (ref 0.76–1.27)
GFR calc Af Amer: 118 mL/min/{1.73_m2} (ref >59)
GFR calc non Af Amer: 102 mL/min/{1.73_m2} (ref >59)
Globulin, Total: 2.7 g/dL (ref 1.5–4.5)
Glucose: 91 mg/dL (ref 65–99)
Potassium: 4.2 mmol/L (ref 3.5–5.2)
Sodium: 141 mmol/L (ref 134–144)

## 2019-06-28 LAB — IFE, PE AND FLC, SERUM
Albumin SerPl Elph-Mcnc: 3.9 g/dL (ref 2.9–4.4)
Albumin/Glob SerPl: 1.3 (ref 0.7–1.7)
Alpha 1: 0.2 g/dL (ref 0.0–0.4)
Alpha2 Glob SerPl Elph-Mcnc: 0.7 g/dL (ref 0.4–1.0)
B-Globulin SerPl Elph-Mcnc: 0.9 g/dL (ref 0.7–1.3)
Gamma Glob SerPl Elph-Mcnc: 1.4 g/dL (ref 0.4–1.8)
Globulin, Total: 3.1 g/dL (ref 2.2–3.9)
Ig Kappa Free Light Chain: 24.5 mg/L — ABNORMAL HIGH (ref 3.3–19.4)
Ig Lambda Free Light Chain: 12.9 mg/L (ref 5.7–26.3)
IgA/Immunoglobulin A, Serum: 327 mg/dL (ref 90–386)
IgG (Immunoglobin G), Serum: 1430 mg/dL (ref 603–1613)
IgM (Immunoglobulin M), Srm: 64 mg/dL (ref 20–172)
Kappa/Lambda FluidC Ratio: 1.9 — ABNORMAL HIGH (ref 0.26–1.65)
M Protein SerPl Elph-Mcnc: 0.7 g/dL — ABNORMAL HIGH
Total Protein: 7 g/dL (ref 6.0–8.5)

## 2019-06-28 LAB — PSA: Prostate Specific Ag, Serum: 1 ng/mL (ref 0.0–4.0)

## 2019-06-28 LAB — VITAMIN D 25 HYDROXY (VIT D DEFICIENCY, FRACTURES): Vit D, 25-Hydroxy: 26.1 ng/mL — ABNORMAL LOW (ref 30.0–100.0)

## 2019-06-28 LAB — PTH, INTACT AND CALCIUM: PTH: 33 pg/mL (ref 15–65)

## 2019-07-05 NOTE — Addendum Note (Signed)
Addended by: Hulan Fray on: 07/05/2019 02:08 PM   Modules accepted: Orders

## 2019-07-06 ENCOUNTER — Other Ambulatory Visit: Payer: Self-pay | Admitting: Student in an Organized Health Care Education/Training Program

## 2019-07-24 ENCOUNTER — Ambulatory Visit: Payer: Medicare HMO | Admitting: Dietician

## 2019-08-07 ENCOUNTER — Telehealth: Payer: Self-pay | Admitting: Dietician

## 2019-08-07 ENCOUNTER — Ambulatory Visit: Payer: Medicare HMO | Admitting: Student in an Organized Health Care Education/Training Program

## 2019-08-07 ENCOUNTER — Ambulatory Visit: Payer: Medicare HMO | Admitting: Dietician

## 2019-08-11 NOTE — Telephone Encounter (Signed)
Left voicemail for return call about scheduling a telehealth nutrtiion follow up visit.

## 2019-08-29 ENCOUNTER — Other Ambulatory Visit: Payer: Self-pay | Admitting: Student in an Organized Health Care Education/Training Program

## 2019-10-19 ENCOUNTER — Telehealth: Payer: Self-pay | Admitting: *Deleted

## 2019-10-19 NOTE — Telephone Encounter (Signed)
Pt calls and states he wants to see dr Damita Dunnings, states he sometimes has chest pain. He was ask if he was having chest pain now- "no" if he becomes short of breath "no" denies all other - H/A, N&V, weakness, sweating,  Appt made for [facility]

## 2019-10-21 ENCOUNTER — Emergency Department (HOSPITAL_COMMUNITY)
Admission: EM | Admit: 2019-10-21 | Discharge: 2019-10-22 | Disposition: A | Discharge status: Home / Self Care | Payer: Medicare HMO | Attending: Emergency Medicine | Admitting: Emergency Medicine

## 2019-10-21 ENCOUNTER — Other Ambulatory Visit: Payer: Self-pay

## 2019-10-21 ENCOUNTER — Encounter (HOSPITAL_COMMUNITY): Payer: Self-pay | Admitting: *Deleted

## 2019-10-21 DIAGNOSIS — M545 Low back pain, unspecified: Secondary | ICD-10-CM

## 2019-10-21 DIAGNOSIS — Z87891 Personal history of nicotine dependence: Secondary | ICD-10-CM | POA: Insufficient documentation

## 2019-10-21 DIAGNOSIS — I10 Essential (primary) hypertension: Secondary | ICD-10-CM | POA: Diagnosis not present

## 2019-10-21 DIAGNOSIS — R1030 Lower abdominal pain, unspecified: Secondary | ICD-10-CM | POA: Diagnosis not present

## 2019-10-21 DIAGNOSIS — R109 Unspecified abdominal pain: Secondary | ICD-10-CM | POA: Diagnosis present

## 2019-10-21 DIAGNOSIS — R52 Pain, unspecified: Secondary | ICD-10-CM | POA: Diagnosis not present

## 2019-10-21 DIAGNOSIS — Z79899 Other long term (current) drug therapy: Secondary | ICD-10-CM | POA: Insufficient documentation

## 2019-10-21 DIAGNOSIS — R1084 Generalized abdominal pain: Secondary | ICD-10-CM | POA: Diagnosis not present

## 2019-10-21 LAB — CBC
HCT: 44.4 % (ref 39.0–52.0)
Hemoglobin: 14.2 g/dL (ref 13.0–17.0)
MCH: 28.7 pg (ref 26.0–34.0)
MCHC: 32 g/dL (ref 30.0–36.0)
MCV: 89.7 fL (ref 80.0–100.0)
Platelets: 247 10*3/uL (ref 150–400)
RBC: 4.95 MIL/uL (ref 4.22–5.81)
RDW: 14.1 % (ref 11.5–15.5)
WBC: 4.9 10*3/uL (ref 4.0–10.5)
nRBC: 0 % (ref 0.0–0.2)

## 2019-10-21 LAB — URINALYSIS, ROUTINE W REFLEX MICROSCOPIC
Bacteria, UA: NONE SEEN
Bilirubin Urine: NEGATIVE
Glucose, UA: NEGATIVE mg/dL
Hgb urine dipstick: NEGATIVE
Ketones, ur: NEGATIVE mg/dL
Leukocytes,Ua: NEGATIVE
Nitrite: NEGATIVE
Protein, ur: NEGATIVE mg/dL
Specific Gravity, Urine: 1.002 — ABNORMAL LOW (ref 1.005–1.030)
pH: 6 (ref 5.0–8.0)

## 2019-10-21 LAB — LIPASE, BLOOD: Lipase: 28 U/L (ref 11–51)

## 2019-10-21 LAB — COMPREHENSIVE METABOLIC PANEL WITH GFR
ALT: 26 U/L (ref 0–44)
AST: 30 U/L (ref 15–41)
Albumin: 4.1 g/dL (ref 3.5–5.0)
Alkaline Phosphatase: 64 U/L (ref 38–126)
Anion gap: 14 (ref 5–15)
BUN: 7 mg/dL (ref 6–20)
CO2: 24 mmol/L (ref 22–32)
Calcium: 9.9 mg/dL (ref 8.9–10.3)
Chloride: 98 mmol/L (ref 98–111)
Creatinine, Ser: 0.61 mg/dL (ref 0.61–1.24)
GFR calc Af Amer: >60 mL/min
GFR calc non Af Amer: >60 mL/min
Glucose, Bld: 92 mg/dL (ref 70–99)
Potassium: 3.8 mmol/L (ref 3.5–5.1)
Sodium: 136 mmol/L (ref 135–145)
Total Bilirubin: 0.7 mg/dL (ref 0.3–1.2)
Total Protein: 7.8 g/dL (ref 6.5–8.1)

## 2019-10-21 MED ORDER — SODIUM CHLORIDE 0.9% FLUSH: 3.0000 mL | Freq: Once | INTRAVENOUS | Status: DC

## 2019-10-21 NOTE — ED Triage Notes (Signed)
The pt is c/o lower back pain he was brought in by [facility]  He has not had injuries.  He has had the back pain for at least a week

## 2019-10-22 ENCOUNTER — Other Ambulatory Visit: Payer: Self-pay

## 2019-10-22 ENCOUNTER — Emergency Department (HOSPITAL_COMMUNITY): Payer: Medicare HMO

## 2019-10-22 DIAGNOSIS — R109 Unspecified abdominal pain: Secondary | ICD-10-CM | POA: Diagnosis not present

## 2019-10-22 DIAGNOSIS — R1030 Lower abdominal pain, unspecified: Secondary | ICD-10-CM | POA: Diagnosis not present

## 2019-10-22 DIAGNOSIS — M545 Low back pain: Secondary | ICD-10-CM | POA: Diagnosis not present

## 2019-10-22 MED ORDER — SODIUM CHLORIDE 0.9 % IV BOLUS
1000.0000 mL | Freq: Once | INTRAVENOUS | Status: AC
  Administered 2019-10-22: 01:00:00 1000 mL via INTRAVENOUS

## 2019-10-22 MED ORDER — FENTANYL CITRATE (PF) 100 MCG/2ML IJ SOLN
50.0000 ug | Freq: Once | INTRAMUSCULAR | Status: AC
  Administered 2019-10-22: 50 ug via INTRAVENOUS
  Filled 2019-10-22: qty 2

## 2019-10-22 MED ORDER — IOHEXOL 300 MG/ML  SOLN
75.0000 mL | Freq: Once | INTRAMUSCULAR | Status: AC | PRN
  Administered 2019-10-22: 75 mL via INTRAVENOUS

## 2019-10-22 NOTE — ED Notes (Signed)
Pt verbalized understanding of d/c instructions and follow up care along with s/s requiring return to ED. Pt had no additional questions at this time.

## 2019-10-22 NOTE — Discharge Instructions (Addendum)
Drink plenty of fluids.  Try to eat a balanced diet.  Take acetaminophen as needed for pain.  Return if symptoms are getting worse.

## 2019-10-22 NOTE — ED Provider Notes (Signed)
[facility] EMERGENCY DEPARTMENT Provider Note   CSN: [id number] Arrival date & time: 10/21/19  2028   History Chief Complaint  Patient presents with  . Back Pain    Gregory Hernandez is a 59 y.o. male.  The history is provided by the patient.  Back Pain He has history of alcohol abuse, hyperlipidemia, monoclonal gammopathy of unknown significance comes in complaining of abdominal pain and back pain which started yesterday.  Unfortunately, he is a very poor historian and I cannot get many details.  He cannot tell me what makes the pain better or worse.  He denies nausea, vomiting, diarrhea.  He denies fever, chills.  He has not done anything to treat his symptoms.  He also relates significant weight loss over the last several months but cannot quantitate it.  Past Medical History:  Diagnosis Date  . Adenomatous colon polyp 12/14/2011  . Dermatitis    H/O Tinea dermatitis  . Elevated BP   . Elevated transaminase level    Mild elevation.  . Facial pain 2007   Left eye and left facial pain January 2007; evaluated by ophthalmologist Dr. Ishmael Holter; CT of face and orbits 09/16/2005 unremarkable; etiology unclear; improved by 10/28/2005.  . Fracture of left shoulder 2012  . Helicobacter pylori gastritis 11/16/2014   EGD done 11/01/2014 by Dr. Michail Sermon to work up weight loss showed gastritis; biopsy showed chronic active gastritis with Helicobacter pylori organisms.  Dr. Kathline Magic notes indicate plan to start treatment with Prevpac.   Marland Kitchen Rhinitis    Seasonal  . TB (tuberculosis) 1996   Pulmonary TB (right upper lobe infiltrate) diagnosed May 1996; treated at [facility]  . Tobacco abuse     Patient Active Problem List   Diagnosis Date Noted  . Hypercalcemia 06/26/2019  . Ear fullness, right 05/10/2019  . Thyroid nodule 06/13/2018  . Osteoarthritis of left knee 11/23/2016  . MGUS (monoclonal gammopathy of unknown significance) 03/09/2016  . Subclinical  hyperthyroidism 10/04/2015  . Hyperlipidemia 06/17/2015  . Alcohol Use Disorder 06/17/2015  . Loss of weight 05/23/2014  . Preventative health care 08/19/2011  . Hypertension 09/21/2008    Past Surgical History:  Procedure Laterality Date  . NO PAST SURGERIES         Family History  Problem Relation Age of Onset  . Hypertension Mother   . Diabetes Mother   . Heart attack Mother   . Peptic Ulcer Mother   . Arthritis Mother   . Cancer Cousin     Social History   Tobacco Use  . Smoking status: Former Smoker    Packs/day: 2.00    Years: 7.00    Pack years: 14.00    Types: Cigarettes    Quit date: 08/18/2009    Years since quitting: 10.1  . Smokeless tobacco: Never Used  Substance Use Topics  . Alcohol use: No    Alcohol/week: 0.0 standard drinks    Comment: About 2-3 beers per day. 2 40 ounces a day  . Drug use: No    Home Medications Prior to Admission medications   Medication Sig Start Date End Date Taking? Authorizing Provider  chlorthalidone (HYGROTON) 25 MG tablet TAKE 1 TABLET BY MOUTH EVERY DAY 07/06/19   Axel Filler, MD  ibuprofen (ADVIL,MOTRIN) 800 MG tablet Take 1 tablet (800 mg total) by mouth every 8 (eight) hours as needed. 04/29/17   Shela Leff, MD  naltrexone (DEPADE) 50 MG tablet Take 1 tablet (50 mg total) by  mouth daily. 05/10/19   Chundi, Verne Spurr, MD  Nutritional Supplements (ENSURE HIGH PROTEIN) POWD Take 771 g by mouth daily. 05/10/19   Lars Mage, MD  pravastatin (PRAVACHOL) 20 MG tablet TAKE 1 TABLET BY MOUTH EVERY DAY IN THE EVENING 08/29/19   Axel Filler, MD    Allergies    Patient has no known allergies.  Review of Systems   Review of Systems  Musculoskeletal: Positive for back pain.  All other systems reviewed and are negative.   Physical Exam Updated Vital Signs BP 126/85 (BP Location: Right Arm)   Pulse 86   Temp 98.2 F (36.8 C) (Oral)   Resp 15   SpO2 98%   Physical Exam Vitals and nursing  note reviewed.   Cachectic 59 year old male, resting comfortably and in no acute distress. Vital signs are normal. Oxygen saturation is 98%, which is normal. Head is normocephalic and atraumatic. PERRLA, EOMI. Oropharynx is clear. Neck is nontender and supple without adenopathy or JVD. Back is nontender and there is no CVA tenderness. Lungs are clear without rales, wheezes, or rhonchi. Chest is nontender. Heart has regular rate and rhythm without murmur. Abdomen is soft, flat, with mild left lower quadrant tenderness.  There is no rebound or guarding.  There are no masses or hepatosplenomegaly and peristalsis is normoactive. Extremities have no cyanosis or edema, full range of motion is present. Skin is warm and dry without rash. Neurologic: Mental status is normal, cranial nerves are intact, there are no motor or sensory deficits.  ED Results / Procedures / Treatments   Labs (all labs ordered are listed, but only abnormal results are displayed) Labs Reviewed  URINALYSIS, ROUTINE W REFLEX MICROSCOPIC - Abnormal; Notable for the following components:      Result Value   Color, Urine COLORLESS (*)    Specific Gravity, Urine 1.002 (*)    All other components within normal limits  LIPASE, BLOOD  COMPREHENSIVE METABOLIC PANEL  CBC   Radiology CT ABDOMEN PELVIS W CONTRAST  Result Date: 10/22/2019 CLINICAL DATA:  Evaluate for diverticulitis. Abdominal pain. EXAM: CT ABDOMEN AND PELVIS WITH CONTRAST TECHNIQUE: Multidetector CT imaging of the abdomen and pelvis was performed using the standard protocol following bolus administration of intravenous contrast. CONTRAST:  93m OMNIPAQUE IOHEXOL 300 MG/ML  SOLN COMPARISON:  None FINDINGS: Lower chest: Granuloma identified within the right lower lobe. Hepatobiliary: No focal liver abnormality is seen. No gallstones, gallbladder wall thickening, or biliary dilatation. Pancreas: Unremarkable. No pancreatic ductal dilatation or surrounding inflammatory  changes. Spleen: Normal in size without focal abnormality. Adrenals/Urinary Tract: Adrenal glands are unremarkable. Kidneys are normal, without renal calculi, focal lesion, or hydronephrosis. Bladder is unremarkable. Stomach/Bowel: Stomach appears nondistended. No dilated loops of small bowel. Gas and contrast is identified within the mildly prominent proximal portion of the appendix which measures up to 7 mm in diameter. Distal appendix is normal in caliber measuring up to 4 mm. No pathologic dilatation of colon. No findings of acute diverticulitis. Vascular/Lymphatic: Mild aortic atherosclerosis. No aneurysm. No abdominopelvic adenopathy Reproductive: Prostate is unremarkable. Other: No abdominal wall hernia or abnormality. No abdominopelvic ascites. Musculoskeletal: No acute or significant osseous findings. IMPRESSION: 1. No acute findings identified within the abdomen or pelvis. No evidence for acute diverticulitis. Aortic Atherosclerosis (ICD10-I70.0). Electronically Signed   By: TKerby MoorsM.D.   On: 10/22/2019 05:20    Procedures Procedures  Medications Ordered in ED Medications  sodium chloride flush (NS) 0.9 % injection 3 mL (has no administration  in time range)  fentaNYL (SUBLIMAZE) injection 50 mcg (has no administration in time range)  sodium chloride 0.9 % bolus 1,000 mL (has no administration in time range)    ED Course  I have reviewed the triage vital signs and the nursing notes.  Pertinent labs & imaging results that were available during my care of the patient were reviewed by me and considered in my medical decision making (see chart for details).  MDM Rules/Calculators/A&P Abdominal pain and back pain of uncertain cause and patient with reported history of monoclonal gammopathy of unknown significance.  Weight loss of uncertain cause.  This would certainly be concerning for development of multiple myeloma, but labs have come back reassuring including normal total protein,  normal albumin, normal calcium.  Given the inability to get a good history, will send for CT of abdomen and pelvis.  Old records are reviewed, and he has no relevant past visits.  Labs are unremarkable.  CT of abdomen and pelvis shows no acute process.  Patient is advised of these findings.  I am still concerned about his weight loss and encouraged him to make sure he stays hydrated and eat a balanced diet.  He is advised to use over-the-counter analgesics as needed for pain, referred back to PCP.  Return precautions discussed.  Final Clinical Impression(s) / ED Diagnoses Final diagnoses:  Lower abdominal pain  Low back pain without sciatica, unspecified back pain laterality, unspecified chronicity    Rx / DC Orders ED Discharge Orders    None       Delora Fuel, MD 69/24/93 [phone]

## 2019-10-23 ENCOUNTER — Ambulatory Visit (INDEPENDENT_AMBULATORY_CARE_PROVIDER_SITE_OTHER): Payer: Medicare HMO | Admitting: Internal Medicine

## 2019-10-23 VITALS — BP 124/84 | HR 100 | Temp 98.2°F | Ht 60.0 in | Wt 84.8 lb

## 2019-10-23 DIAGNOSIS — R634 Abnormal weight loss: Secondary | ICD-10-CM

## 2019-10-23 DIAGNOSIS — M545 Low back pain, unspecified: Secondary | ICD-10-CM | POA: Insufficient documentation

## 2019-10-23 DIAGNOSIS — Z681 Body mass index (BMI) 19 or less, adult: Secondary | ICD-10-CM

## 2019-10-23 DIAGNOSIS — D472 Monoclonal gammopathy: Secondary | ICD-10-CM | POA: Diagnosis not present

## 2019-10-23 NOTE — Progress Notes (Signed)
   CC: Lower back pain  HPI:  [name] is a 59 y.o. community dwelling gentleman with medical history significant for MGUS and weight loss presenting for evaluation of low back pain.  Please see problem based charting for further details.    Past Medical History:  Diagnosis Date  . Adenomatous colon polyp 12/14/2011  . Dermatitis    H/O Tinea dermatitis  . Elevated BP   . Elevated transaminase level    Mild elevation.  . Facial pain 2007   Left eye and left facial pain January 2007; evaluated by ophthalmologist Dr. Ishmael Holter; CT of face and orbits 09/16/2005 unremarkable; etiology unclear; improved by 10/28/2005.  . Fracture of left shoulder 2012  . Helicobacter pylori gastritis 11/16/2014   EGD done 11/01/2014 by Dr. Michail Sermon to work up weight loss showed gastritis; biopsy showed chronic active gastritis with Helicobacter pylori organisms.  Dr. Kathline Magic notes indicate plan to start treatment with Prevpac.   Marland Kitchen Rhinitis    Seasonal  . TB (tuberculosis) 1996   Pulmonary TB (right upper lobe infiltrate) diagnosed May 1996; treated at [facility] Department  . Tobacco abuse    Review of Systems:  As per HPI  Physical Exam:  Vitals:   10/23/19 1428  BP: 124/84  Pulse: 100  Temp: 98.2 F (36.8 C)  TempSrc: Oral  SpO2: 100%  Weight: 84 lb 12.8 oz (38.5 kg)  Height: 5' (1.524 m)   Physical Exam  Constitutional:  Thin gentleman  HENT:  Head: Normocephalic and atraumatic.  Cardiovascular: Normal rate and regular rhythm.  No murmur heard. Pulmonary/Chest: Effort normal and breath sounds normal. No respiratory distress. He has no wheezes.  Musculoskeletal:        General: No tenderness (No point tenderness of lumbar spine). Normal range of motion.     Comments: -Straight leg test unremarkable bilaterally  Neurological:  Motor strength intact bilaterally at the LE    Assessment & Plan:   See Encounters Tab for problem based charting.  Patient discussed  with Dr. Lynnae January

## 2019-10-23 NOTE — Patient Instructions (Signed)
Mr. Sandra,   Thanks for seeing [place] today. I am going to order an MRI of your back to make sure there is nothing acute happening.   I will call you with the results.   Take care! Dr. Eileen Stanford  Please call the internal medicine center clinic if you have any questions or concerns, we may be able to help and keep you from a long and expensive emergency room wait. Our clinic and after hours phone number is [phone], the best time to call is Monday through Friday 9 am to 4 pm but there is always someone available 24/7 if you have an emergency. If you need medication refills please notify your pharmacy one week in advance and they will send [place] a request.

## 2019-10-23 NOTE — Assessment & Plan Note (Signed)
Low back pain: Gregory Hernandez reports that he was in his usual state of health until about a week and half when he began experiencing lower back pain.  The pain is constant in nature and does not radiate to his lower extremities.  He states the pain does get worse with hyperextension of his spine and improves with flexion.  He denies any radiculopathic symptoms.  He rates the pain at "100/10."  He denies lower extremity weakness, urinary or bowel incontinence, fevers or chills.  He does mention concurrent discomfort in his abdomen and questionable constipation though he continues to have regular bowel movement.  He did tell me that he did not have BM yesterday or the day before but his bowel movements have always been inconsistent.  He visited the emergency department on 10/21/2019 with similar complaints and CT abdomen was performed which did not reveal any acute or significant osseous findings.  In regards to his MGUS, labs performed in October 2020 were unremarkable without evidence of CRAB or Anemia.  Of note, he has had progressive weight loss (99 pounds in 2017>> 84 pounds today. BMI of 16.5)  Assessment: Suspected spinal stenosis.  However given his weight loss, I am concerned about an underlying pathology  Plan: -Follow-up MRI lumbar spine

## 2019-10-24 ENCOUNTER — Ambulatory Visit (INDEPENDENT_AMBULATORY_CARE_PROVIDER_SITE_OTHER): Payer: Medicare HMO | Admitting: Internal Medicine

## 2019-10-24 ENCOUNTER — Telehealth: Payer: Self-pay | Admitting: *Deleted

## 2019-10-24 VITALS — BP 122/78 | HR 84 | Temp 98.0°F | Ht 60.0 in | Wt 86.0 lb

## 2019-10-24 DIAGNOSIS — M5440 Lumbago with sciatica, unspecified side: Secondary | ICD-10-CM

## 2019-10-24 DIAGNOSIS — M545 Low back pain, unspecified: Secondary | ICD-10-CM

## 2019-10-24 LAB — POCT URINALYSIS DIPSTICK
Blood, UA: NEGATIVE
Glucose, UA: NEGATIVE
Ketones, UA: NEGATIVE
Leukocytes, UA: NEGATIVE
Nitrite, UA: NEGATIVE
Protein, UA: POSITIVE — AB
Spec Grav, UA: >=1.03 — AB (ref 1.010–1.025)
Urobilinogen, UA: 1 E.U./dL
pH, UA: 6 (ref 5.0–8.0)

## 2019-10-24 MED ORDER — HYDROCODONE-ACETAMINOPHEN 5-325 MG PO TABS: 1.0000 | ORAL_TABLET | Freq: Four times a day (QID) | ORAL | 0 refills | Status: AC | PRN

## 2019-10-24 NOTE — Progress Notes (Signed)
   CC: Follow-up lower back pain  HPI:  Gregory Hernandez is a 60 y.o. with medical history significant for MGUS, possible osteoporosis with history of fragility fracture and weight loss presenting to follow-up on low back pain.  Please see problem based charting for further details.  Past Medical History:  Diagnosis Date  . Adenomatous colon polyp 12/14/2011  . Dermatitis    H/O Tinea dermatitis  . Elevated BP   . Elevated transaminase level    Mild elevation.  . Facial pain 2007   Left eye and left facial pain January 2007; evaluated by ophthalmologist Dr. Ishmael Holter; CT of face and orbits 09/16/2005 unremarkable; etiology unclear; improved by 10/28/2005.  . Fracture of left shoulder 2012  . Helicobacter pylori gastritis 11/16/2014   EGD done 11/01/2014 by Dr. Michail Sermon to work up weight loss showed gastritis; biopsy showed chronic active gastritis with Helicobacter pylori organisms.  Dr. Kathline Magic notes indicate plan to start treatment with Prevpac.   Marland Kitchen Rhinitis    Seasonal  . TB (tuberculosis) 1996   Pulmonary TB (right upper lobe infiltrate) diagnosed May 1996; treated at [facility]  . Tobacco abuse    Review of Systems:  AS per HPI  Physical Exam:  Vitals:   10/24/19 1441  BP: 122/78  Pulse: 84  Temp: 98 F (36.7 C)  TempSrc: Oral  SpO2: 100%  Weight: 86 lb (39 kg)  Height: 5' (1.524 m)   Physical Exam  Constitutional: No distress.  Thin gentleman  Musculoskeletal:        General: No deformity or edema.     Lumbar back: Tenderness (With hyperextension of spine) present. No swelling, edema, deformity or bony tenderness. Normal range of motion.  Psychiatric: Mood and affect normal.    Assessment & Plan:   See Encounters Tab for problem based charting.  Patient discussed with Dr. Evette Doffing

## 2019-10-24 NOTE — Assessment & Plan Note (Addendum)
Acute low back pain: I personally evaluated Gregory Hernandez on 10/23/2019 and he complained of 1 and 1/2-week history of low back pain that worsens with hyperextension of his spine and improves with flexion of his spine.  As previously stated in my note from 10/23/2019, he did not have any inciting trauma or excessive physical activity that preceded his pain.  He denies any radiculopathic symptoms.  He does not endorse fevers or chills.  During my assessment yesterday, I ordered a stat MRI of his lumbar spine however this has to go through insurance authorization.  Of note, he had a CT abdomen pelvis performed which did not reveal any acute vertebral fractures.  Plan: -Follow-up MRI of his lumbar spine -Given a short course of Norco 5-325 mg #28  ADDENDUM MRI lumbar spine unremarkable.

## 2019-10-24 NOTE — Telephone Encounter (Signed)
Pt's mother calls and states pt is c/o congestion and breathing hard, cannot assess pt, ask mother if he sounds breathless- denies Chest pain-denies, N&V-denies. Ask to call 911, she states he owes a bill and they cannot afford another one Scheduled ACC at 1545 today 2/16

## 2019-10-24 NOTE — Patient Instructions (Signed)
Gregory Hernandez,   Thanks for seeing [place] today. For your back pain, we will order an MRI of your back. I will also give you a short prescription of pain medication for 7 days. Please do not drink alcohol while on this medication.   Take care! Dr. Eileen Stanford  Please call the internal medicine center clinic if you have any questions or concerns, we may be able to help and keep you from a long and expensive emergency room wait. Our clinic and after hours phone number is [phone], the best time to call is Monday through Friday 9 am to 4 pm but there is always someone available 24/7 if you have an emergency. If you need medication refills please notify your pharmacy one week in advance and they will send [place] a request.

## 2019-10-25 ENCOUNTER — Ambulatory Visit (HOSPITAL_COMMUNITY)
Admission: RE | Admit: 2019-10-25 | Discharge: 2019-10-25 | Disposition: A | Discharge status: Home / Self Care | Payer: Medicare HMO | Source: Ambulatory Visit | Attending: Internal Medicine | Admitting: Internal Medicine

## 2019-10-25 ENCOUNTER — Other Ambulatory Visit: Payer: Self-pay

## 2019-10-25 DIAGNOSIS — M545 Low back pain, unspecified: Secondary | ICD-10-CM

## 2019-10-25 NOTE — Progress Notes (Signed)
Internal Medicine Clinic Attending  Case discussed with Dr. Agyei at the time of the visit.  We reviewed the resident's history and exam and pertinent patient test results.  I agree with the assessment, diagnosis, and plan of care documented in the resident's note.    

## 2019-10-25 NOTE — Progress Notes (Signed)
Internal Medicine Clinic Attending  I saw and evaluated the patient.  I personally confirmed the key portions of the history and exam documented by Dr. Agyei and I reviewed pertinent patient test results.  The assessment, diagnosis, and plan were formulated together and I agree with the documentation in the resident's note.  

## 2019-11-04 ENCOUNTER — Other Ambulatory Visit: Payer: Self-pay

## 2019-11-04 ENCOUNTER — Emergency Department (HOSPITAL_COMMUNITY): Payer: Medicare HMO

## 2019-11-04 ENCOUNTER — Emergency Department (HOSPITAL_COMMUNITY)
Admission: EM | Admit: 2019-11-04 | Discharge: 2019-11-04 | Disposition: A | Discharge status: Home / Self Care | Payer: Medicare HMO | Attending: Emergency Medicine | Admitting: Emergency Medicine

## 2019-11-04 ENCOUNTER — Encounter (HOSPITAL_COMMUNITY): Payer: Self-pay

## 2019-11-04 DIAGNOSIS — R531 Weakness: Secondary | ICD-10-CM

## 2019-11-04 DIAGNOSIS — Z79899 Other long term (current) drug therapy: Secondary | ICD-10-CM | POA: Diagnosis not present

## 2019-11-04 DIAGNOSIS — R0602 Shortness of breath: Secondary | ICD-10-CM | POA: Diagnosis not present

## 2019-11-04 DIAGNOSIS — Z87891 Personal history of nicotine dependence: Secondary | ICD-10-CM | POA: Diagnosis not present

## 2019-11-04 DIAGNOSIS — R519 Headache, unspecified: Secondary | ICD-10-CM

## 2019-11-04 DIAGNOSIS — R5381 Other malaise: Secondary | ICD-10-CM | POA: Diagnosis not present

## 2019-11-04 DIAGNOSIS — I10 Essential (primary) hypertension: Secondary | ICD-10-CM | POA: Diagnosis not present

## 2019-11-04 LAB — URINALYSIS, ROUTINE W REFLEX MICROSCOPIC
Bilirubin Urine: NEGATIVE
Glucose, UA: NEGATIVE mg/dL
Hgb urine dipstick: NEGATIVE
Ketones, ur: NEGATIVE mg/dL
Leukocytes,Ua: NEGATIVE
Nitrite: NEGATIVE
Protein, ur: NEGATIVE mg/dL
Specific Gravity, Urine: 1.006 (ref 1.005–1.030)
pH: 6 (ref 5.0–8.0)

## 2019-11-04 LAB — COMPREHENSIVE METABOLIC PANEL
ALT: 31 U/L (ref 0–44)
AST: 33 U/L (ref 15–41)
Albumin: 4.1 g/dL (ref 3.5–5.0)
Alkaline Phosphatase: 72 U/L (ref 38–126)
Anion gap: 14 (ref 5–15)
BUN: <5 mg/dL — ABNORMAL LOW (ref 6–20)
CO2: 27 mmol/L (ref 22–32)
Calcium: 9.6 mg/dL (ref 8.9–10.3)
Chloride: 97 mmol/L — ABNORMAL LOW (ref 98–111)
Creatinine, Ser: 0.57 mg/dL — ABNORMAL LOW (ref 0.61–1.24)
GFR calc Af Amer: >60 mL/min (ref >60)
GFR calc non Af Amer: >60 mL/min (ref >60)
Glucose, Bld: 100 mg/dL — ABNORMAL HIGH (ref 70–99)
Potassium: 3 mmol/L — ABNORMAL LOW (ref 3.5–5.1)
Sodium: 138 mmol/L (ref 135–145)
Total Bilirubin: 0.8 mg/dL (ref 0.3–1.2)
Total Protein: 7.9 g/dL (ref 6.5–8.1)

## 2019-11-04 LAB — CBC WITH DIFFERENTIAL/PLATELET
Abs Immature Granulocytes: 0 10*3/uL (ref 0.00–0.07)
Basophils Absolute: 0.1 10*3/uL (ref 0.0–0.1)
Basophils Relative: 4 %
Eosinophils Absolute: 0.3 10*3/uL (ref 0.0–0.5)
Eosinophils Relative: 9 %
HCT: 44 % (ref 39.0–52.0)
Hemoglobin: 14.8 g/dL (ref 13.0–17.0)
Lymphocytes Relative: 65 %
Lymphs Abs: 2.2 10*3/uL (ref 0.7–4.0)
MCH: 28.7 pg (ref 26.0–34.0)
MCHC: 33.6 g/dL (ref 30.0–36.0)
MCV: 85.3 fL (ref 80.0–100.0)
Monocytes Absolute: 0.3 10*3/uL (ref 0.1–1.0)
Monocytes Relative: 9 %
Neutro Abs: 0.4 10*3/uL — ABNORMAL LOW (ref 1.7–7.7)
Neutrophils Relative %: 13 %
Platelets: 333 10*3/uL (ref 150–400)
RBC: 5.16 MIL/uL (ref 4.22–5.81)
RDW: 13.2 % (ref 11.5–15.5)
WBC: 3.4 10*3/uL — ABNORMAL LOW (ref 4.0–10.5)
nRBC: 0 % (ref 0.0–0.2)
nRBC: 0 /100 WBC

## 2019-11-04 LAB — LIPASE, BLOOD: Lipase: 21 U/L (ref 11–51)

## 2019-11-04 LAB — TROPONIN I (HIGH SENSITIVITY)
Troponin I (High Sensitivity): <2 ng/L (ref <18)
Troponin I (High Sensitivity): <2 ng/L (ref <18)

## 2019-11-04 MED ORDER — ACETAMINOPHEN 325 MG PO TABS
650.0000 mg | ORAL_TABLET | Freq: Once | ORAL | Status: AC
  Administered 2019-11-04: 15:00:00 650 mg via ORAL
  Filled 2019-11-04: qty 2

## 2019-11-04 MED ORDER — SODIUM CHLORIDE 0.9 % IV BOLUS
1000.0000 mL | Freq: Once | INTRAVENOUS | Status: AC
  Administered 2019-11-04: 1000 mL via INTRAVENOUS

## 2019-11-04 NOTE — ED Provider Notes (Signed)
[facility] EMERGENCY DEPARTMENT Provider Note   CSN: [id number] Arrival date & time: 11/04/19  1345     History Chief Complaint  Patient presents with  . "Not feeling well"    Gregory Hernandez is a 59 y.o. male.  He is a very poor historian.  He is brought in by EMS because he does not feel well.  He is not able to explain but does not feel well.  He said he has had a headache but does not know how long its been there.  He also complains that he is hungry but can't eat.  Denies any fevers chills cough nausea vomiting diarrhea constipation or urinary symptoms.  He said he is losing weight.   The history is provided by the patient.  Headache Pain location:  Generalized Quality:  Dull Radiates to:  Does not radiate Severity currently:  Unable to specify Severity at highest:  Unable to specify Onset quality:  Unable to specify Relieved by:  None tried Worsened by:  Nothing Ineffective treatments:  None tried Associated symptoms: back pain   Associated symptoms: no abdominal pain, no cough, no diarrhea, no eye pain, no fever, no nausea, no sore throat and no vomiting        Past Medical History:  Diagnosis Date  . Adenomatous colon polyp 12/14/2011  . Dermatitis    H/O Tinea dermatitis  . Elevated BP   . Elevated transaminase level    Mild elevation.  . Facial pain 2007   Left eye and left facial pain January 2007; evaluated by ophthalmologist Dr. Ishmael Holter; CT of face and orbits 09/16/2005 unremarkable; etiology unclear; improved by 10/28/2005.  . Fracture of left shoulder 2012  . Helicobacter pylori gastritis 11/16/2014   EGD done 11/01/2014 by Dr. Michail Sermon to work up weight loss showed gastritis; biopsy showed chronic active gastritis with Helicobacter pylori organisms.  Dr. Kathline Magic notes indicate plan to start treatment with Prevpac.   Marland Kitchen Rhinitis    Seasonal  . TB (tuberculosis) 1996   Pulmonary TB (right upper lobe infiltrate) diagnosed May 1996; treated  at [facility]  . Tobacco abuse     Patient Active Problem List   Diagnosis Date Noted  . Lower back pain 10/23/2019  . Hypercalcemia 06/26/2019  . Ear fullness, right 05/10/2019  . Thyroid nodule 06/13/2018  . Osteoarthritis of left knee 11/23/2016  . MGUS (monoclonal gammopathy of unknown significance) 03/09/2016  . Subclinical hyperthyroidism 10/04/2015  . Hyperlipidemia 06/17/2015  . Alcohol Use Disorder 06/17/2015  . Loss of weight 05/23/2014  . Preventative health care 08/19/2011  . Hypertension 09/21/2008    Past Surgical History:  Procedure Laterality Date  . NO PAST SURGERIES         Family History  Problem Relation Age of Onset  . Hypertension Mother   . Diabetes Mother   . Heart attack Mother   . Peptic Ulcer Mother   . Arthritis Mother   . Cancer Cousin     Social History   Tobacco Use  . Smoking status: Former Smoker    Packs/day: 2.00    Years: 7.00    Pack years: 14.00    Types: Cigarettes    Quit date: 08/18/2009    Years since quitting: 10.2  . Smokeless tobacco: Never Used  Substance Use Topics  . Alcohol use: No    Alcohol/week: 0.0 standard drinks    Comment: About 2-3 beers per day. 2 40 ounces a day  .  Drug use: No    Home Medications Prior to Admission medications   Medication Sig Start Date End Date Taking? Authorizing Provider  chlorthalidone (HYGROTON) 25 MG tablet TAKE 1 TABLET BY MOUTH EVERY DAY 07/06/19   Axel Filler, MD  ibuprofen (ADVIL,MOTRIN) 800 MG tablet Take 1 tablet (800 mg total) by mouth every 8 (eight) hours as needed. 04/29/17   Shela Leff, MD  naltrexone (DEPADE) 50 MG tablet Take 1 tablet (50 mg total) by mouth daily. 05/10/19   Chundi, Verne Spurr, MD  Nutritional Supplements (ENSURE HIGH PROTEIN) POWD Take 771 g by mouth daily. 05/10/19   Lars Mage, MD  pravastatin (PRAVACHOL) 20 MG tablet TAKE 1 TABLET BY MOUTH EVERY DAY IN THE EVENING 08/29/19   Axel Filler, MD      Allergies    Patient has no known allergies.  Review of Systems   Review of Systems  Constitutional: Positive for appetite change (??). Negative for fever.  HENT: Negative for sore throat.   Eyes: Negative for pain.  Respiratory: Negative for cough.   Cardiovascular: Negative for chest pain.  Gastrointestinal: Negative for abdominal pain, diarrhea, nausea and vomiting.  Genitourinary: Negative for dysuria.  Musculoskeletal: Positive for back pain.  Skin: Negative for wound.  Neurological: Positive for headaches.    Physical Exam Updated Vital Signs BP 114/81 (BP Location: Right Arm)   Pulse 91   Temp 98.4 F (36.9 C) (Oral)   Resp 14   SpO2 100%   Physical Exam Vitals and nursing note reviewed.  Constitutional:      Appearance: He is cachectic.  HENT:     Head: Normocephalic and atraumatic.  Eyes:     Conjunctiva/sclera: Conjunctivae normal.  Cardiovascular:     Rate and Rhythm: Normal rate and regular rhythm.     Heart sounds: No murmur.  Pulmonary:     Effort: Pulmonary effort is normal. No respiratory distress.     Breath sounds: Normal breath sounds.  Abdominal:     Palpations: Abdomen is soft.     Tenderness: There is no abdominal tenderness.  Musculoskeletal:        General: No deformity or signs of injury. Normal range of motion.     Cervical back: Neck supple.     Right lower leg: No edema.     Left lower leg: No edema.  Skin:    General: Skin is warm and dry.     Capillary Refill: Capillary refill takes less than 2 seconds.  Neurological:     General: No focal deficit present.     Mental Status: He is alert.     ED Results / Procedures / Treatments   Labs (all labs ordered are listed, but only abnormal results are displayed) Labs Reviewed  COMPREHENSIVE METABOLIC PANEL - Abnormal; Notable for the following components:      Result Value   Potassium 3.0 (*)    Chloride 97 (*)    Glucose, Bld 100 (*)    BUN <5 (*)    Creatinine, Ser 0.57  (*)    All other components within normal limits  CBC WITH DIFFERENTIAL/PLATELET - Abnormal; Notable for the following components:   WBC 3.4 (*)    Neutro Abs 0.4 (*)    All other components within normal limits  LIPASE, BLOOD  URINALYSIS, ROUTINE W REFLEX MICROSCOPIC  PATHOLOGIST SMEAR REVIEW  TROPONIN I (HIGH SENSITIVITY)  TROPONIN I (HIGH SENSITIVITY)    EKG EKG Interpretation  Date/Time:  Saturday November 04 2019 14:32:09 EST Ventricular Rate:  87 PR Interval:  124 QRS Duration: 74 QT Interval:  380 QTC Calculation: 457 R Axis:   77 Text Interpretation: Normal sinus rhythm Normal ECG No old tracing to compare Confirmed by Aletta Edouard [phone]) on 11/04/2019 2:44:43 PM   Radiology CT Head Wo Contrast  Result Date: 11/04/2019 CLINICAL DATA:  Headache. EXAM: CT HEAD WITHOUT CONTRAST TECHNIQUE: Contiguous axial images were obtained from the base of the skull through the vertex without intravenous contrast. COMPARISON:  MRI dated 10/21/2005. FINDINGS: Brain: No evidence of acute infarction, hemorrhage, hydrocephalus, extra-axial collection or mass lesion/mass effect. Vascular: No hyperdense vessel or unexpected calcification. Skull: Normal. Negative for fracture or focal lesion. Sinuses/Orbits: There is mucosal thickening involving the ethmoid air cells and right maxillary sinus. There is mucosal thickening of the frontal sinuses. The mastoid air cells are essentially clear. Other: None. IMPRESSION: 1. No acute intracranial abnormality. 2. Sinus disease as detailed above. Electronically Signed   By: Constance Holster M.D.   On: 11/04/2019 16:02    Procedures Procedures (including critical care time)  Medications Ordered in ED Medications  sodium chloride 0.9 % bolus 1,000 mL (0 mLs Intravenous Stopped 11/04/19 1624)  acetaminophen (TYLENOL) tablet 650 mg (650 mg Oral Given 11/04/19 1513)    ED Course  I have reviewed the triage vital signs and the nursing notes.  Pertinent  labs & imaging results that were available during my care of the patient were reviewed by me and considered in my medical decision making (see chart for details).    MDM Rules/Calculators/A&P                      ddx includes bleed, mass, dehydration, metabolic derangement, anemia, FTT  Patient signed out to Dr Vanita Panda to followup on labs abn imaging. Likely discharge if no significant findings.   Final Clinical Impression(s) / ED Diagnoses Final diagnoses:  Generalized headache  Malaise    Rx / DC Orders ED Discharge Orders    None       Hayden Rasmussen, MD 11/04/19 [phone]

## 2019-11-04 NOTE — Discharge Instructions (Signed)
As discussed, your evaluation today has been largely reassuring.  But, it is important that you monitor your condition carefully, and do not hesitate to return to the ED if you develop new, or concerning changes in your condition. ? ?Otherwise, please follow-up with your physician for appropriate ongoing care. ? ?

## 2019-11-04 NOTE — ED Triage Notes (Signed)
Pt arrived via [facility] stating he does "not feel well, c/o a HA while in route with EMS, tearful upon arrival.

## 2019-11-07 LAB — PATHOLOGIST SMEAR REVIEW

## 2019-11-10 DIAGNOSIS — H2513 Age-related nuclear cataract, bilateral: Secondary | ICD-10-CM | POA: Diagnosis not present

## 2019-12-21 ENCOUNTER — Emergency Department (HOSPITAL_COMMUNITY)
Admission: EM | Admit: 2019-12-21 | Discharge: 2019-12-22 | Disposition: A | Discharge status: Home / Self Care | Payer: Medicare HMO | Attending: Emergency Medicine | Admitting: Emergency Medicine

## 2019-12-21 ENCOUNTER — Encounter (HOSPITAL_COMMUNITY): Payer: Self-pay

## 2019-12-21 DIAGNOSIS — R634 Abnormal weight loss: Secondary | ICD-10-CM | POA: Insufficient documentation

## 2019-12-21 DIAGNOSIS — Z87891 Personal history of nicotine dependence: Secondary | ICD-10-CM | POA: Diagnosis not present

## 2019-12-21 DIAGNOSIS — G44209 Tension-type headache, unspecified, not intractable: Secondary | ICD-10-CM | POA: Insufficient documentation

## 2019-12-21 DIAGNOSIS — I10 Essential (primary) hypertension: Secondary | ICD-10-CM | POA: Insufficient documentation

## 2019-12-21 DIAGNOSIS — Z79899 Other long term (current) drug therapy: Secondary | ICD-10-CM | POA: Insufficient documentation

## 2019-12-21 DIAGNOSIS — R52 Pain, unspecified: Secondary | ICD-10-CM | POA: Diagnosis not present

## 2019-12-21 DIAGNOSIS — G4489 Other headache syndrome: Secondary | ICD-10-CM | POA: Diagnosis not present

## 2019-12-21 DIAGNOSIS — M5489 Other dorsalgia: Secondary | ICD-10-CM | POA: Diagnosis not present

## 2019-12-21 DIAGNOSIS — R519 Headache, unspecified: Secondary | ICD-10-CM | POA: Diagnosis present

## 2019-12-21 NOTE — ED Triage Notes (Signed)
Pt bib [facility] w/ c/o headache and back pain x 1 week.

## 2019-12-22 ENCOUNTER — Other Ambulatory Visit: Payer: Self-pay

## 2019-12-22 ENCOUNTER — Ambulatory Visit (INDEPENDENT_AMBULATORY_CARE_PROVIDER_SITE_OTHER)
Admission: EM | Admit: 2019-12-22 | Discharge: 2019-12-22 | Disposition: A | Discharge status: Home / Self Care | Payer: Medicare HMO | Source: Home / Self Care

## 2019-12-22 ENCOUNTER — Encounter (HOSPITAL_COMMUNITY): Payer: Self-pay

## 2019-12-22 ENCOUNTER — Telehealth: Payer: Self-pay | Admitting: *Deleted

## 2019-12-22 DIAGNOSIS — Z789 Other specified health status: Secondary | ICD-10-CM | POA: Diagnosis not present

## 2019-12-22 DIAGNOSIS — E440 Moderate protein-calorie malnutrition: Secondary | ICD-10-CM | POA: Diagnosis not present

## 2019-12-22 NOTE — Telephone Encounter (Signed)
Pt calls and states he is sick, feels bad going on for days. He was in ED 4/15. disch home. He wants to come to clinic today for eval. Informed no more available appts. Ask to go to urg care or call 911. He states he will go to [facility] care.

## 2019-12-22 NOTE — ED Provider Notes (Signed)
[facility]    CSN: [id number] Arrival date & time: 12/22/19  1148      History   Chief Complaint Chief Complaint  Patient presents with  . Anorexia  . Generalized Body Aches    HPI Gregory Hernandez is a 59 y.o. male.   HPI  Patient presents for evaluation of weight. Patient reports that since he has taken the COVID-19 vaccine he has been unable to gain weight. He is able to eat however he reports he never gains weight. After further review of the EMR this is been a longstanding problem and has been evaluated by GI Dr. Michail Sermon back in 2016. Patient saw his PCP back in February and weight 86 pounds. His weight today is 86 pounds on today. He is prescribed high-protein Ensure which he assures me today that he drinks 2-3 times per day. He has a history of alcohol abuse and he reports that he stopped drinking over a month ago. He is not having nausea, vomiting, diarrhea and he endorses eating at least 2-3 meals per day. He received his last Covid vaccine on 12/18/2019. Past Medical History:  Diagnosis Date  . Adenomatous colon polyp 12/14/2011  . Dermatitis    H/O Tinea dermatitis  . Elevated BP   . Elevated transaminase level    Mild elevation.  . Facial pain 2007   Left eye and left facial pain January 2007; evaluated by ophthalmologist Dr. Ishmael Holter; CT of face and orbits 09/16/2005 unremarkable; etiology unclear; improved by 10/28/2005.  . Fracture of left shoulder 2012  . Helicobacter pylori gastritis 11/16/2014   EGD done 11/01/2014 by Dr. Michail Sermon to work up weight loss showed gastritis; biopsy showed chronic active gastritis with Helicobacter pylori organisms.  Dr. Kathline Magic notes indicate plan to start treatment with Prevpac.   Marland Kitchen Rhinitis    Seasonal  . TB (tuberculosis) 1996   Pulmonary TB (right upper lobe infiltrate) diagnosed May 1996; treated at [facility]  . Tobacco abuse     Patient Active Problem List   Diagnosis Date Noted  . Lower  back pain 10/23/2019  . Hypercalcemia 06/26/2019  . Ear fullness, right 05/10/2019  . Thyroid nodule 06/13/2018  . Osteoarthritis of left knee 11/23/2016  . MGUS (monoclonal gammopathy of unknown significance) 03/09/2016  . Subclinical hyperthyroidism 10/04/2015  . Hyperlipidemia 06/17/2015  . Alcohol Use Disorder 06/17/2015  . Loss of weight 05/23/2014  . Preventative health care 08/19/2011  . Hypertension 09/21/2008    Past Surgical History:  Procedure Laterality Date  . NO PAST SURGERIES      Home Medications    Prior to Admission medications   Medication Sig Start Date End Date Taking? Authorizing Provider  chlorthalidone (HYGROTON) 25 MG tablet TAKE 1 TABLET BY MOUTH EVERY DAY 07/06/19   Axel Filler, MD  ibuprofen (ADVIL,MOTRIN) 800 MG tablet Take 1 tablet (800 mg total) by mouth every 8 (eight) hours as needed. 04/29/17   Shela Leff, MD  naltrexone (DEPADE) 50 MG tablet Take 1 tablet (50 mg total) by mouth daily. 05/10/19   Chundi, Verne Spurr, MD  Nutritional Supplements (ENSURE HIGH PROTEIN) POWD Take 771 g by mouth daily. 05/10/19   Lars Mage, MD  pravastatin (PRAVACHOL) 20 MG tablet TAKE 1 TABLET BY MOUTH EVERY DAY IN THE EVENING 08/29/19   Axel Filler, MD    Family History Family History  Problem Relation Age of Onset  . Hypertension Mother   . Diabetes Mother   . Heart attack  Mother   . Peptic Ulcer Mother   . Arthritis Mother   . Cancer Cousin     Social History Social History   Tobacco Use  . Smoking status: Former Smoker    Packs/day: 2.00    Years: 7.00    Pack years: 14.00    Types: Cigarettes    Quit date: 08/18/2009    Years since quitting: 10.3  . Smokeless tobacco: Never Used  Substance Use Topics  . Alcohol use: No    Alcohol/week: 0.0 standard drinks    Comment: About 2-3 beers per day. 2 40 ounces a day  . Drug use: No     Allergies   Patient has no known allergies.   Review of Systems Review of  Systems Pertinent negatives listed in HPI Physical Exam Triage Vital Signs ED Triage Vitals  Enc Vitals Group     BP 12/22/19 1301 (!) 152/82     Pulse Rate 12/22/19 1301 94     Resp 12/22/19 1301 16     Temp 12/22/19 1301 98.3 F (36.8 C)     Temp Source 12/22/19 1301 Oral     SpO2 12/22/19 1301 100 %     Weight --      Height --      Head Circumference --      Peak Flow --      Pain Score 12/22/19 1252 0     Pain Loc --      Pain Edu? --      Excl. in [place]? --    No data found.  Updated Vital Signs BP (!) 152/82 (BP Location: Right Arm)   Pulse 94   Temp 98.3 F (36.8 C) (Oral)   Resp 16   SpO2 100%   Visual Acuity Right Eye Distance:   Left Eye Distance:   Bilateral Distance:    Right Eye Near:   Left Eye Near:    Bilateral Near:     Physical Exam Constitutional:      Appearance: He is cachectic.     Comments: Chronically ill-appearing  Cardiovascular:     Rate and Rhythm: Normal rate and regular rhythm.  Pulmonary:     Effort: Pulmonary effort is normal.     Breath sounds: Normal breath sounds.  Abdominal:     General: Abdomen is flat. Bowel sounds are normal.  Skin:    General: Skin is dry.  Neurological:     Mental Status: He is oriented to person, place, and time.  Psychiatric:        Mood and Affect: Mood normal.        Behavior: Behavior normal.     Comments: Patient has a speech impediment.      UC Treatments / Results  Labs (all labs ordered are listed, but only abnormal results are displayed) Labs Reviewed - No data to display  EKG   Radiology No results found.  Procedures Procedures (including critical care time)  Medications Ordered in UC Medications - No data to display  Initial Impression / Assessment and Plan / UC Course  I have reviewed the triage vital signs and the nursing notes.  Pertinent labs & imaging results that were available during my care of the patient were reviewed by me and considered in my medical  decision making (see chart for details).    Advised about management of weight  Moderate protein-calorie malnutrition ([place])   Final Clinical Impressions(s) / UC Diagnoses   Final diagnoses:  Advised  about management of weight  Moderate protein-calorie malnutrition [facility])     Discharge Instructions     Keep follow-up appointment with your primary care doctor for further evaluation and recommendations on helping you gain weight.  Continue drinking Ensure.  Congratulations that she have stop drinking alcohol I do recommend that you start eating at least 6-8 small meals per day high in carbohydrates and protein.    ED Prescriptions    None     PDMP not reviewed this encounter.   Scot Jun, FNP 12/22/19 1345

## 2019-12-22 NOTE — ED Provider Notes (Signed)
[facility] EMERGENCY DEPARTMENT Provider Note  CSN: [id number] Arrival date & time: 12/21/19 1839  Chief Complaint(s) Headache  HPI Gregory Hernandez is a 59 y.o. male who reported back pain and headache to triage for 1 week, but decline this to me. He is concerned regarding his weight. He states that he feels he is too skinny, stating that he is eating well, but still can't put on weight. He denies diarrhea, emesis, recent fevers or infection. No chest pain or SOB. No abd pain.   Headache   Past Medical History Past Medical History:  Diagnosis Date  . Adenomatous colon polyp 12/14/2011  . Dermatitis    H/O Tinea dermatitis  . Elevated BP   . Elevated transaminase level    Mild elevation.  . Facial pain 2007   Left eye and left facial pain January 2007; evaluated by ophthalmologist Dr. Ishmael Holter; CT of face and orbits 09/16/2005 unremarkable; etiology unclear; improved by 10/28/2005.  . Fracture of left shoulder 2012  . Helicobacter pylori gastritis 11/16/2014   EGD done 11/01/2014 by Dr. Michail Sermon to work up weight loss showed gastritis; biopsy showed chronic active gastritis with Helicobacter pylori organisms.  Dr. Kathline Magic notes indicate plan to start treatment with Prevpac.   Marland Kitchen Rhinitis    Seasonal  . TB (tuberculosis) 1996   Pulmonary TB (right upper lobe infiltrate) diagnosed May 1996; treated at [facility]  . Tobacco abuse    Patient Active Problem List   Diagnosis Date Noted  . Lower back pain 10/23/2019  . Hypercalcemia 06/26/2019  . Ear fullness, right 05/10/2019  . Thyroid nodule 06/13/2018  . Osteoarthritis of left knee 11/23/2016  . MGUS (monoclonal gammopathy of unknown significance) 03/09/2016  . Subclinical hyperthyroidism 10/04/2015  . Hyperlipidemia 06/17/2015  . Alcohol Use Disorder 06/17/2015  . Loss of weight 05/23/2014  . Preventative health care 08/19/2011  . Hypertension 09/21/2008   Home Medication(s) Prior to  Admission medications   Medication Sig Start Date End Date Taking? Authorizing Provider  chlorthalidone (HYGROTON) 25 MG tablet TAKE 1 TABLET BY MOUTH EVERY DAY 07/06/19   Axel Filler, MD  ibuprofen (ADVIL,MOTRIN) 800 MG tablet Take 1 tablet (800 mg total) by mouth every 8 (eight) hours as needed. 04/29/17   Shela Leff, MD  naltrexone (DEPADE) 50 MG tablet Take 1 tablet (50 mg total) by mouth daily. 05/10/19   Chundi, Verne Spurr, MD  Nutritional Supplements (ENSURE HIGH PROTEIN) POWD Take 771 g by mouth daily. 05/10/19   Lars Mage, MD  pravastatin (PRAVACHOL) 20 MG tablet TAKE 1 TABLET BY MOUTH EVERY DAY IN THE EVENING 08/29/19   Axel Filler, MD                                                                                                                                    Past Surgical History Past Surgical History:  Procedure Laterality Date  .  NO PAST SURGERIES     Family History Family History  Problem Relation Age of Onset  . Hypertension Mother   . Diabetes Mother   . Heart attack Mother   . Peptic Ulcer Mother   . Arthritis Mother   . Cancer Cousin     Social History Social History   Tobacco Use  . Smoking status: Former Smoker    Packs/day: 2.00    Years: 7.00    Pack years: 14.00    Types: Cigarettes    Quit date: 08/18/2009    Years since quitting: 10.3  . Smokeless tobacco: Never Used  Substance Use Topics  . Alcohol use: No    Alcohol/week: 0.0 standard drinks    Comment: About 2-3 beers per day. 2 40 ounces a day  . Drug use: No   Allergies Patient has no known allergies.  Review of Systems Review of Systems  Neurological: Positive for headaches.   All other systems are reviewed and are negative for acute change except as noted in the HPI  Physical Exam Vital Signs  I have reviewed the triage vital signs BP (!) 146/66   Pulse 78   Temp 97.8 F (36.6 C) (Oral)   Resp 15   Ht 5' 2.5" (1.588 m)   SpO2 100%   BMI 15.48  kg/m   Physical Exam Vitals reviewed.  Constitutional:      General: He is not in acute distress.    Appearance: He is well-developed and underweight. He is not diaphoretic.  HENT:     Head: Normocephalic and atraumatic.     Nose: Nose normal.  Eyes:     General: No scleral icterus.       Right eye: No discharge.        Left eye: No discharge.     Conjunctiva/sclera: Conjunctivae normal.     Pupils: Pupils are equal, round, and reactive to light.  Cardiovascular:     Rate and Rhythm: Normal rate and regular rhythm.     Heart sounds: No murmur. No friction rub. No gallop.   Pulmonary:     Effort: Pulmonary effort is normal. No respiratory distress.     Breath sounds: Normal breath sounds. No stridor. No rales.  Abdominal:     General: There is no distension.     Palpations: Abdomen is soft.     Tenderness: There is no abdominal tenderness.  Musculoskeletal:        General: No tenderness.     Cervical back: Normal range of motion and neck supple.  Skin:    General: Skin is warm and dry.     Findings: No erythema or rash.  Neurological:     Mental Status: He is alert and oriented to person, place, and time.     ED Results and Treatments Labs (all labs ordered are listed, but only abnormal results are displayed) Labs Reviewed - No data to display  EKG  EKG Interpretation  Date/Time:    Ventricular Rate:    PR Interval:    QRS Duration:   QT Interval:    QTC Calculation:   R Axis:     Text Interpretation:        Radiology No results found.  Pertinent labs & imaging results that were available during my care of the patient were reviewed by me and considered in my medical decision making (see chart for details).  Medications Ordered in ED Medications - No data to display                                                                                                                                   Procedures Procedures  (including critical care time)  Medical Decision Making / ED Course I have reviewed the nursing notes for this encounter and the patient's prior records (if available in EHR or on provided paperwork).   Gregory Hernandez was evaluated in Emergency Department on 12/22/2019 for the symptoms described in the history of present illness. He was evaluated in the context of the global COVID-19 pandemic, which necessitated consideration that the patient might be at risk for infection with the SARS-CoV-2 virus that causes COVID-19. Institutional protocols and algorithms that pertain to the evaluation of patients at risk for COVID-19 are in a state of rapid change based on information released by regulatory bodies including the CDC and federal and state organizations. These policies and algorithms were followed during the patient's care in the ED.  The patient appears well, in no acute distress, without evidence of toxicity or dehydration. No emergent intervention needed at this time.   Recommended supplemental shakes and PCP follow up.      Final Clinical Impression(s) / ED Diagnoses Final diagnoses:  Weight loss  Tension-type headache, not intractable, unspecified chronicity pattern   The patient appears reasonably screened and/or stabilized for discharge and I doubt any other medical condition or other [facility] requiring further screening, evaluation, or treatment in the ED at this time prior to discharge. Safe for discharge with strict return precautions.  Disposition: Discharge  Condition: Good  I have discussed the results, Dx and Tx plan with the patient/family who expressed understanding and agree(s) with the plan. Discharge instructions discussed at length. The patient/family was given strict return precautions who verbalized understanding of the instructions. No further questions at time of discharge.    ED Discharge  Orders    None      Follow Up: Axel Filler, MD [place] [address] [phone]  Schedule an appointment as soon as possible for a visit        This chart was dictated using voice recognition software.  Despite best efforts to proofread,  errors can occur which can change the documentation meaning.   Fatima Blank, MD 12/22/19 [phone]

## 2019-12-22 NOTE — Discharge Instructions (Signed)
Keep follow-up appointment with your primary care doctor for further evaluation and recommendations on helping you gain weight.  Continue drinking Ensure.  Congratulations that she have stop drinking alcohol I do recommend that you start eating at least 6-8 small meals per day high in carbohydrates and protein.

## 2019-12-22 NOTE — ED Triage Notes (Signed)
Pt is here with loss of appetite and body ever since his COVID vaccine on 12/18/2019, Pt has not taken anything to relieve discomfort.

## 2019-12-24 ENCOUNTER — Emergency Department (HOSPITAL_COMMUNITY): Payer: Medicare HMO

## 2019-12-24 ENCOUNTER — Emergency Department (HOSPITAL_COMMUNITY)
Admission: EM | Admit: 2019-12-24 | Discharge: 2019-12-24 | Disposition: A | Discharge status: Home / Self Care | Payer: Medicare HMO | Attending: Emergency Medicine | Admitting: Emergency Medicine

## 2019-12-24 ENCOUNTER — Encounter (HOSPITAL_COMMUNITY): Payer: Self-pay | Admitting: Emergency Medicine

## 2019-12-24 ENCOUNTER — Other Ambulatory Visit: Payer: Self-pay

## 2019-12-24 DIAGNOSIS — R0602 Shortness of breath: Secondary | ICD-10-CM | POA: Diagnosis not present

## 2019-12-24 DIAGNOSIS — R531 Weakness: Secondary | ICD-10-CM | POA: Diagnosis not present

## 2019-12-24 DIAGNOSIS — Z87891 Personal history of nicotine dependence: Secondary | ICD-10-CM | POA: Diagnosis not present

## 2019-12-24 DIAGNOSIS — I10 Essential (primary) hypertension: Secondary | ICD-10-CM | POA: Insufficient documentation

## 2019-12-24 LAB — BASIC METABOLIC PANEL
Anion gap: 11 (ref 5–15)
BUN: 5 mg/dL — ABNORMAL LOW (ref 6–20)
CO2: 27 mmol/L (ref 22–32)
Calcium: 10.1 mg/dL (ref 8.9–10.3)
Chloride: 100 mmol/L (ref 98–111)
Creatinine, Ser: 0.66 mg/dL (ref 0.61–1.24)
GFR calc Af Amer: >60 mL/min (ref >60)
GFR calc non Af Amer: >60 mL/min (ref >60)
Glucose, Bld: 104 mg/dL — ABNORMAL HIGH (ref 70–99)
Potassium: 3.7 mmol/L (ref 3.5–5.1)
Sodium: 138 mmol/L (ref 135–145)

## 2019-12-24 LAB — URINALYSIS, ROUTINE W REFLEX MICROSCOPIC
Bilirubin Urine: NEGATIVE
Glucose, UA: NEGATIVE mg/dL
Hgb urine dipstick: NEGATIVE
Ketones, ur: NEGATIVE mg/dL
Leukocytes,Ua: NEGATIVE
Nitrite: NEGATIVE
Protein, ur: NEGATIVE mg/dL
Specific Gravity, Urine: 1.01 (ref 1.005–1.030)
pH: 8 (ref 5.0–8.0)

## 2019-12-24 LAB — CBC
HCT: 46.3 % (ref 39.0–52.0)
Hemoglobin: 15 g/dL (ref 13.0–17.0)
MCH: 28.5 pg (ref 26.0–34.0)
MCHC: 32.4 g/dL (ref 30.0–36.0)
MCV: 88 fL (ref 80.0–100.0)
Platelets: 217 10*3/uL (ref 150–400)
RBC: 5.26 MIL/uL (ref 4.22–5.81)
RDW: 14.6 % (ref 11.5–15.5)
WBC: 4.1 10*3/uL (ref 4.0–10.5)
nRBC: 0 % (ref 0.0–0.2)

## 2019-12-24 LAB — HEPATIC FUNCTION PANEL
ALT: 22 U/L (ref 0–44)
AST: 31 U/L (ref 15–41)
Albumin: 4.1 g/dL (ref 3.5–5.0)
Alkaline Phosphatase: 81 U/L (ref 38–126)
Bilirubin, Direct: 0.1 mg/dL (ref 0.0–0.2)
Indirect Bilirubin: 0.4 mg/dL (ref 0.3–0.9)
Total Bilirubin: 0.5 mg/dL (ref 0.3–1.2)
Total Protein: 8 g/dL (ref 6.5–8.1)

## 2019-12-24 MED ORDER — SODIUM CHLORIDE 0.9% FLUSH: 3.0000 mL | Freq: Once | INTRAVENOUS | Status: DC

## 2019-12-24 NOTE — ED Provider Notes (Signed)
[facility] EMERGENCY DEPARTMENT Provider Note   CSN: [id number] Arrival date & time: 12/24/19  1243     History Chief Complaint  Patient presents with  . Weakness    Gregory Hernandez is a 59 y.o. male.  The history is provided by the patient and medical records. No language interpreter was used.   Gregory Hernandez is a 59 y.o. male who presents to the Emergency Department complaining of weakness.  He presents to the ED complaining of one week of generalized weakness. History is limited as patient is a very vague historian. He complains of one week of generalized weakness. He is able to eat and drink but reports poor appetite. He denies any fevers, nausea, vomiting, abdominal pain, chest pain. No reports of injuries. He is compliant with his home medications. He does have mild occasional shortness of breath. He did receive his COVID-19 vaccine on April 12. He lives at home alone. He denies any tobacco use, drug use. He uses occasional alcohol, last use was one week ago.    Past Medical History:  Diagnosis Date  . Adenomatous colon polyp 12/14/2011  . Dermatitis    H/O Tinea dermatitis  . Elevated BP   . Elevated transaminase level    Mild elevation.  . Facial pain 2007   Left eye and left facial pain January 2007; evaluated by ophthalmologist Dr. Ishmael Holter; CT of face and orbits 09/16/2005 unremarkable; etiology unclear; improved by 10/28/2005.  . Fracture of left shoulder 2012  . Helicobacter pylori gastritis 11/16/2014   EGD done 11/01/2014 by Dr. Michail Sermon to work up weight loss showed gastritis; biopsy showed chronic active gastritis with Helicobacter pylori organisms.  Dr. Kathline Magic notes indicate plan to start treatment with Prevpac.   Marland Kitchen Rhinitis    Seasonal  . TB (tuberculosis) 1996   Pulmonary TB (right upper lobe infiltrate) diagnosed May 1996; treated at [facility]  . Tobacco abuse     Patient Active Problem List   Diagnosis Date Noted    . Lower back pain 10/23/2019  . Hypercalcemia 06/26/2019  . Ear fullness, right 05/10/2019  . Thyroid nodule 06/13/2018  . Osteoarthritis of left knee 11/23/2016  . MGUS (monoclonal gammopathy of unknown significance) 03/09/2016  . Subclinical hyperthyroidism 10/04/2015  . Hyperlipidemia 06/17/2015  . Alcohol Use Disorder 06/17/2015  . Loss of weight 05/23/2014  . Preventative health care 08/19/2011  . Hypertension 09/21/2008    Past Surgical History:  Procedure Laterality Date  . NO PAST SURGERIES         Family History  Problem Relation Age of Onset  . Hypertension Mother   . Diabetes Mother   . Heart attack Mother   . Peptic Ulcer Mother   . Arthritis Mother   . Cancer Cousin     Social History   Tobacco Use  . Smoking status: Former Smoker    Packs/day: 2.00    Years: 7.00    Pack years: 14.00    Types: Cigarettes    Quit date: 08/18/2009    Years since quitting: 10.3  . Smokeless tobacco: Never Used  Substance Use Topics  . Alcohol use: No    Alcohol/week: 0.0 standard drinks    Comment: About 2-3 beers per day. 2 40 ounces a day  . Drug use: No    Home Medications Prior to Admission medications   Medication Sig Start Date End Date Taking? Authorizing Provider  chlorthalidone (HYGROTON) 25 MG tablet TAKE 1 TABLET BY  MOUTH EVERY DAY 07/06/19   Axel Filler, MD  ibuprofen (ADVIL,MOTRIN) 800 MG tablet Take 1 tablet (800 mg total) by mouth every 8 (eight) hours as needed. 04/29/17   Shela Leff, MD  naltrexone (DEPADE) 50 MG tablet Take 1 tablet (50 mg total) by mouth daily. 05/10/19   Chundi, Verne Spurr, MD  Nutritional Supplements (ENSURE HIGH PROTEIN) POWD Take 771 g by mouth daily. 05/10/19   Lars Mage, MD  pravastatin (PRAVACHOL) 20 MG tablet TAKE 1 TABLET BY MOUTH EVERY DAY IN THE EVENING 08/29/19   Axel Filler, MD    Allergies    Patient has no known allergies.  Review of Systems   Review of Systems  All other systems  reviewed and are negative.   Physical Exam Updated Vital Signs BP (!) 155/89   Pulse 97   Temp 98.7 F (37.1 C) (Oral)   Resp 15   SpO2 100%   Physical Exam Vitals and nursing note reviewed.  Constitutional:      Appearance: He is well-developed.  HENT:     Head: Normocephalic and atraumatic.  Cardiovascular:     Rate and Rhythm: Normal rate and regular rhythm.     Heart sounds: No murmur.  Pulmonary:     Effort: Pulmonary effort is normal. No respiratory distress.     Breath sounds: Normal breath sounds.  Abdominal:     Palpations: Abdomen is soft.     Tenderness: There is no abdominal tenderness. There is no guarding or rebound.  Musculoskeletal:        General: No tenderness.  Skin:    General: Skin is warm and dry.  Neurological:     Mental Status: He is alert and oriented to person, place, and time.     Comments: Five out of five strength in all four extremities with sensation to light touch intact in all four extremities. 1-2+ patellar reflexes bilaterally  Psychiatric:        Behavior: Behavior normal.     ED Results / Procedures / Treatments   Labs (all labs ordered are listed, but only abnormal results are displayed) Labs Reviewed  BASIC METABOLIC PANEL - Abnormal; Notable for the following components:      Result Value   Glucose, Bld 104 (*)    BUN 5 (*)    All other components within normal limits  URINALYSIS, ROUTINE W REFLEX MICROSCOPIC - Abnormal; Notable for the following components:   Color, Urine STRAW (*)    All other components within normal limits  CBC  HEPATIC FUNCTION PANEL    EKG EKG Interpretation  Date/Time:  Sunday December 24 2019 12:53:30 EDT Ventricular Rate:  81 PR Interval:  112 QRS Duration: 68 QT Interval:  360 QTC Calculation: 418 R Axis:   83 Text Interpretation: Normal sinus rhythm Minimal voltage criteria for LVH, may be normal variant ( Sokolow-Lyon ) Nonspecific ST abnormality Abnormal ECG Confirmed by Quintella Reichert [phone]) on 12/24/2019 3:21:51 PM   Radiology DG Chest 2 View  Result Date: 12/24/2019 CLINICAL DATA:  Weakness and shortness of breath. Decreased appetite. Symptoms since receding COVID vaccine 12/18/2019 EXAM: CHEST - 2 VIEW COMPARISON:  Radiograph 11/04/2019 FINDINGS: Frontal view is rotated leading to asymmetric density of the hemithoraces. Minimal right upper lobe scarring is unchanged. Normal heart size and mediastinal contours. No acute consolidation, pulmonary edema, pleural effusion or pneumothorax. No acute osseous abnormalities are seen. IMPRESSION: No acute abnormality.  Stable right upper lobe scarring. Electronically Signed  By: Keith Rake M.D.   On: 12/24/2019 16:14    Procedures Procedures (including critical care time)  Medications Ordered in ED Medications  sodium chloride flush (NS) 0.9 % injection 3 mL (3 mLs Intravenous Not Given 12/24/19 1440)    ED Course  I have reviewed the triage vital signs and the nursing notes.  Pertinent labs & imaging results that were available during my care of the patient were reviewed by me and considered in my medical decision making (see chart for details).    MDM Rules/Calculators/A&P                     patient here for evaluation of progressive generalized weakness and poor appetite. He is frail and chronically ill appearing on evaluation but in no acute distress. No evidence of significant anemia or electrolyte abnormality. He has symmetric and good strength in all four extremities presentation is not consistent with CVA, cauda equina, GBS. Discussed with patient home care for generalized weakness. Discussed encouraging oral fluid hydration, outpatient follow-up and return precautions.  Final Clinical Impression(s) / ED Diagnoses Final diagnoses:  Generalized weakness    Rx / DC Orders ED Discharge Orders    None       Quintella Reichert, MD 12/24/19 2034

## 2019-12-24 NOTE — ED Triage Notes (Signed)
Pt reports generalized weakness and decreased appetite since receiving the COVID shot on 4/12.

## 2020-01-29 ENCOUNTER — Ambulatory Visit (INDEPENDENT_AMBULATORY_CARE_PROVIDER_SITE_OTHER): Payer: Medicare HMO | Admitting: Student in an Organized Health Care Education/Training Program

## 2020-01-29 ENCOUNTER — Encounter: Payer: Self-pay | Admitting: Student in an Organized Health Care Education/Training Program

## 2020-01-29 VITALS — BP 136/81 | HR 72 | Temp 97.9°F | Ht 60.0 in | Wt 85.4 lb

## 2020-01-29 DIAGNOSIS — Z0000 Encounter for general adult medical examination without abnormal findings: Secondary | ICD-10-CM

## 2020-01-29 DIAGNOSIS — I10 Essential (primary) hypertension: Secondary | ICD-10-CM | POA: Diagnosis not present

## 2020-01-29 DIAGNOSIS — E041 Nontoxic single thyroid nodule: Secondary | ICD-10-CM

## 2020-01-29 DIAGNOSIS — R634 Abnormal weight loss: Secondary | ICD-10-CM

## 2020-01-29 DIAGNOSIS — Z7289 Other problems related to lifestyle: Secondary | ICD-10-CM

## 2020-01-29 DIAGNOSIS — F1010 Alcohol abuse, uncomplicated: Secondary | ICD-10-CM

## 2020-01-29 NOTE — Assessment & Plan Note (Signed)
Up-to-date on age-appropriate cancer screening.  Got his Covid vaccinations completed.  Due for annual fecal immunochemical testing in June, we will provide him with a kit today.

## 2020-01-29 NOTE — Assessment & Plan Note (Signed)
Blood pressure is well controlled.  Plan to continue chlorthalidone 25 mg daily.  Labs in April were normal.

## 2020-01-29 NOTE — Progress Notes (Signed)
   Assessment and Plan:  See Encounters tab for problem-based medical decision making.   __________________________________________________________  HPI:   59 year old person here for follow-up of low weight and hypertension.  Reports doing fairly well over the last 9 months.  Lives at home, independence, functional, but does rely on family who live close by for many advance activities like shopping and transportation.  Cut back on alcohol, no longer drinking beer on a daily basis, drinks occasional wine.  3 visits emergency department in April for weakness and low back pain, he says that this is now resolved and not a problem anymore.  Two visits in February to the emergency department for similar.  Work-ups have all been normal, no admissions.  Not seeing any other physicians, no surgeries.  Reports good adherence to medications, no side effects.  Says he eats 1 meal per day, denies pain with eating, denies diet aphasia, denies a sensation of food getting stuck.  No nausea or vomiting.  __________________________________________________________  Problem List: Patient Active Problem List   Diagnosis Date Noted  . Thyroid nodule 06/13/2018    Priority: High  . Loss of weight 05/23/2014    Priority: High  . Hypertension 09/21/2008    Priority: High  . Osteoarthritis of left knee 11/23/2016    Priority: Medium  . MGUS (monoclonal gammopathy of unknown significance) 03/09/2016    Priority: Medium  . Subclinical hyperthyroidism 10/04/2015    Priority: Medium  . Hyperlipidemia 06/17/2015    Priority: Medium  . Alcohol Use Disorder 06/17/2015    Priority: Medium  . Preventative health care 08/19/2011    Priority: Low    Medications: Reconciled today in Epic __________________________________________________________  Physical Exam:  Vital Signs: Vitals:   01/29/20 1020  BP: 136/81  Pulse: 72  Temp: 97.9 F (36.6 C)  TempSrc: Oral  SpO2: 100%  Weight: 85 lb 6.4 oz (38.7 kg)   Height: 5' (1.524 m)    Gen: Thin, chronically ill-appearing Eyes: Bilateral benign racial melanosis CV: RRR, no murmurs Pulm: Normal effort, CTA throughout, no wheezing Abd: Soft, NT, ND  Ext: Warm, no edema, normal joints

## 2020-01-29 NOTE — Assessment & Plan Note (Signed)
Symptomatically stable, patient reports significantly cutting back on alcohol use.  He is naltrexone 50 mg daily briefly last fall, has not call for refills.  Says he can continue sobriety without the need of naltrexone.  I told him if he has increased cravings in the future to call me and we will refill this medicine.

## 2020-01-29 NOTE — Assessment & Plan Note (Signed)
Continues to have mildly enlarged thyroid, palpable left-sided nodule.  Last ultrasound 2019 with TI-RADS 3, this was biopsied with FNA and was a Bethesda 2 benign follicular nodule.  I ordered thyroid ultrasound today to continue with annual surveillance of the other nodules.

## 2020-01-29 NOTE — Assessment & Plan Note (Signed)
Weight stable at 85 pounds, BMI of 16.  Still only eating 1 meal per day.  Making progress on cutting back alcohol.  Labs from April all looked appropriate with no signs of malnutrition, protein deficiency, or vitamin deficiency.  Maintaining up-to-date on age-appropriate cancer screening, also following thyroid nodules and MGUS.  No he has a abnormally low BMI he has shown stability over the last several years, so it does not seem this is malignancy related.

## 2020-02-12 ENCOUNTER — Other Ambulatory Visit: Payer: Medicare HMO

## 2020-02-12 DIAGNOSIS — Z0000 Encounter for general adult medical examination without abnormal findings: Secondary | ICD-10-CM

## 2020-02-14 LAB — FECAL OCCULT BLOOD, IMMUNOCHEMICAL: Fecal Occult Bld: POSITIVE — AB

## 2020-02-15 ENCOUNTER — Telehealth: Payer: Self-pay | Admitting: Student in an Organized Health Care Education/Training Program

## 2020-02-15 DIAGNOSIS — R195 Other fecal abnormalities: Secondary | ICD-10-CM

## 2020-02-15 NOTE — Telephone Encounter (Signed)
I tried calling Gregory Hernandez about positive FIT. No answer, I left a VM for him to call the clinic back. We will need to do a colonoscopy to rule out colon cancer as a cause. I will place that referral.   When he calls back, can you communicate this to him please? I am happy to answer questions if he has them.

## 2020-03-04 ENCOUNTER — Ambulatory Visit (HOSPITAL_COMMUNITY): Payer: Medicare HMO

## 2020-03-20 ENCOUNTER — Other Ambulatory Visit: Payer: Self-pay

## 2020-03-20 ENCOUNTER — Ambulatory Visit (HOSPITAL_COMMUNITY)
Admission: RE | Admit: 2020-03-20 | Discharge: 2020-03-20 | Disposition: A | Discharge status: Home / Self Care | Payer: Medicare HMO | Source: Ambulatory Visit | Attending: Student in an Organized Health Care Education/Training Program | Admitting: Student in an Organized Health Care Education/Training Program

## 2020-03-20 DIAGNOSIS — E042 Nontoxic multinodular goiter: Secondary | ICD-10-CM | POA: Diagnosis not present

## 2020-03-20 DIAGNOSIS — E041 Nontoxic single thyroid nodule: Secondary | ICD-10-CM | POA: Insufficient documentation

## 2020-03-21 ENCOUNTER — Telehealth: Payer: Self-pay | Admitting: Student in an Organized Health Care Education/Training Program

## 2020-03-21 NOTE — Telephone Encounter (Signed)
Multinodular goiter on surveillance ultrasound program since 2019. I called Mr. Cong and gave results of reassuring ultrasound from this week. No biopsy needed this time. Plan to continue with annual ultrasound surveillance through 2024.

## 2020-04-25 NOTE — Addendum Note (Signed)
Addended by: Hulan Fray on: 04/25/2020 04:54 PM   Modules accepted: Orders

## 2020-05-07 DIAGNOSIS — R195 Other fecal abnormalities: Secondary | ICD-10-CM | POA: Diagnosis not present

## 2020-05-09 ENCOUNTER — Other Ambulatory Visit: Payer: Self-pay | Admitting: *Deleted

## 2020-05-09 MED ORDER — PRAVASTATIN SODIUM 20 MG PO TABS: ORAL_TABLET | ORAL | 3 refills | Status: DC

## 2020-05-09 MED ORDER — CHLORTHALIDONE 25 MG PO TABS: 25.0000 mg | ORAL_TABLET | Freq: Every day | ORAL | 3 refills | Status: DC

## 2020-06-17 DIAGNOSIS — Z1159 Encounter for screening for other viral diseases: Secondary | ICD-10-CM | POA: Diagnosis not present

## 2020-06-20 DIAGNOSIS — R195 Other fecal abnormalities: Secondary | ICD-10-CM | POA: Diagnosis not present

## 2020-06-20 DIAGNOSIS — K640 First degree hemorrhoids: Secondary | ICD-10-CM | POA: Diagnosis not present

## 2020-07-17 ENCOUNTER — Other Ambulatory Visit: Payer: Self-pay

## 2020-07-17 ENCOUNTER — Ambulatory Visit (INDEPENDENT_AMBULATORY_CARE_PROVIDER_SITE_OTHER): Payer: Medicare HMO | Admitting: Internal Medicine

## 2020-07-17 ENCOUNTER — Encounter: Payer: Self-pay | Admitting: Internal Medicine

## 2020-07-17 VITALS — BP 140/80 | HR 82 | Temp 98.4°F | Wt 93.8 lb

## 2020-07-17 DIAGNOSIS — Z23 Encounter for immunization: Secondary | ICD-10-CM

## 2020-07-17 DIAGNOSIS — M1711 Unilateral primary osteoarthritis, right knee: Secondary | ICD-10-CM | POA: Insufficient documentation

## 2020-07-17 MED ORDER — DICLOFENAC SODIUM ER 100 MG PO TB24: 100.0000 mg | ORAL_TABLET | Freq: Every day | ORAL | 0 refills | Status: AC

## 2020-07-17 MED ORDER — DICLOFENAC SODIUM 1 % EX GEL: 2.0000 g | Freq: Four times a day (QID) | CUTANEOUS | 0 refills | Status: DC

## 2020-07-17 NOTE — Assessment & Plan Note (Signed)
Presents for evaluation of right medial knee pain. Is described as dull pain and prevented patient from sleeping well overnight. The pain is constant and no specific test illicits knee pain on exam. No effusion , erythema, ecchymosis, or warmth on exam. The patient has tried 400 mg of ibuprofen this morning without relief. Denies a history of gout. Has a history of OA in left knee.    Assessment: Primary osteoarthritis of right knee Plan: Diclofenac Sodium CR 100 MG 24 hr tablet; Take 1 tablet (100 mg total) by mouth daily for 14 days.  Dispense: 14 tablet; Refill: 0 - diclofenac Sodium (VOLTAREN) 1 % GEL; Apply 2 g topically 4 (four) times daily.  Dispense: 100 g; Refill: 0 - Consider intraarticular steroid injection if no improvement in 2 weeks, patient given return instructions

## 2020-07-17 NOTE — Patient Instructions (Signed)
Thank you, Mr.Worden for allowing [place] to provide your care today. Today we discussed knee pain.  I expect  this is from arthritis.   I have ordered the following labs for you:  Lab Orders  No laboratory test(s) ordered today     Tests ordered today:  none  Referrals ordered today:   Referral Orders  No referral(s) requested today     I have ordered the following medication/changed the following medications:   Stop the following medications: Medications Discontinued During This Encounter  Medication Reason  . ibuprofen (ADVIL,MOTRIN) 800 MG tablet One time medication     Start the following medications: Meds ordered this encounter  Medications  . Diclofenac Sodium CR 100 MG 24 hr tablet    Sig: Take 1 tablet (100 mg total) by mouth daily for 14 days.    Dispense:  14 tablet    Refill:  0  . diclofenac Sodium (VOLTAREN) 1 % GEL    Sig: Apply 2 g topically 4 (four) times daily.    Dispense:  100 g    Refill:  0     Follow up: as needed    Remember: Follow up in 2 weeks if your pain is not better. Take the oral medication daily for two weeks regardless if your knee is hurting. Use the cream as needed for pain relief.   Should you have any questions or concerns please call the internal medicine clinic at [phone].      Tamsen Snider, M.D. [place]

## 2020-07-17 NOTE — Progress Notes (Signed)
   CC: Right knee pain  HPI:[name] is a 59 y.o. male who presents for evaluation of right knee pain. Please see individual problem based A/P for details.  Past Medical History:  Diagnosis Date  . Adenomatous colon polyp 12/14/2011  . Dermatitis    H/O Tinea dermatitis  . Elevated BP   . Elevated transaminase level    Mild elevation.  . Facial pain 2007   Left eye and left facial pain January 2007; evaluated by ophthalmologist Dr. Ishmael Holter; CT of face and orbits 09/16/2005 unremarkable; etiology unclear; improved by 10/28/2005.  . Fracture of left shoulder 2012  . Helicobacter pylori gastritis 11/16/2014   EGD done 11/01/2014 by Dr. Michail Sermon to work up weight loss showed gastritis; biopsy showed chronic active gastritis with Helicobacter pylori organisms.  Dr. Kathline Magic notes indicate plan to start treatment with Prevpac.   Marland Kitchen Rhinitis    Seasonal  . TB (tuberculosis) 1996   Pulmonary TB (right upper lobe infiltrate) diagnosed May 1996; treated at [facility]  . Tobacco abuse    Review of Systems:   Review of Systems  Constitutional: Negative for chills and fever.  Musculoskeletal: Negative for falls and myalgias.  Skin: Negative for rash.       erythema     Physical Exam: Vitals:   07/17/20 1317  BP: 140/80  Pulse: 82  Temp: 98.4 F (36.9 C)  TempSrc: Oral  SpO2: 99%  Weight: 93 lb 12.8 oz (42.5 kg)     General: NAD, nl appearance HEENT: Normocephalic, atraumatic , Conjunctiva nl  Cardiovascular: Normal rate, regular rhythm.  No murmurs, rubs, or gallops Pulmonary : Equal breath sounds, No wheezes, rales, or rhonchi Right Knee: No gross deformity, ecchymoses, swelling. No TTP. FROM. Negative ant/post drawers. Negative valgus/varus testing. Negative lachmans. Negative mcmurrays, apleys, patellar apprehension. NV intact distally.    Assessment & Plan:   See Encounters Tab for problem based charting.  Patient discussed with Dr.  Jimmye Norman

## 2020-07-18 NOTE — Progress Notes (Signed)
Internal Medicine Clinic Attending  Case discussed with Dr. Steen  At the time of the visit.  We reviewed the resident's history and exam and pertinent patient test results.  I agree with the assessment, diagnosis, and plan of care documented in the resident's note.  

## 2020-10-09 ENCOUNTER — Encounter: Payer: Self-pay | Admitting: *Deleted

## 2020-10-17 NOTE — Progress Notes (Signed)
Things That May Be Affecting Your Health: x Alcohol  Hearing loss  Pain    Depression  Home Safety  Sexual Health   Diabetes  Lack of physical activity  Stress   Difficulty with daily activities  Loneliness  Tiredness   Drug use x Medicines  Tobacco use   Falls  Motor Vehicle Safety x Weight   Food choices  Oral Health  Other    YOUR PERSONALIZED HEALTH PLAN : 1. Schedule your next subsequent Medicare Wellness visit in one year 2. Attend all of your regular appointments to address your medical issues 3. Complete the preventative screenings and services   Annual Wellness Visit   Medicare Covered Preventative Screenings and [place] Men and Women Who How Often Need? Date of Last Service Action  Abdominal Aortic Aneurysm Adults with AAA risk factors Once     Alcohol Misuse and Counseling All Adults Screening once a year if no alcohol misuse. Counseling up to 4 face to face sessions.     Bone Density Measurement  Adults at risk for osteoporosis Once every 2 yrs     Lipid Panel Z13.6 All adults without CV disease Once every 5 yrs     Colorectal Cancer   Stool sample or  Colonoscopy All adults 85 and older   Once every year  Every 10 years     Depression All Adults Once a year  Today   Diabetes Screening Blood glucose, post glucose load, or GTT Z13.1  All adults at risk  Pre-diabetics  Once per year  Twice per year     Diabetes  Self-Management Training All adults Diabetics 10 hrs first year; 2 hours subsequent years. Requires Copay     Glaucoma  Diabetics  Family history of glaucoma  African Americans 32 yrs +  Hispanic Americans 57 yrs + Annually - requires coppay     Hepatitis C Z72.89 or F19.20  High Risk for HCV  Born between 1945 and 1965  Annually  Once     HIV Z11.4 All adults based on risk  Annually btw ages 68 & 37 regardless of risk  Annually > 65 yrs if at increased risk     Lung Cancer Screening Asymptomatic adults aged  65-77 with 30 pack yr history and current smoker OR quit within the last 15 yrs Annually Must have counseling and shared decision making documentation before first screen     Medical Nutrition Therapy Adults with   Diabetes  Renal disease  Kidney transplant within past 3 yrs 3 hours first year; 2 hours subsequent years     Obesity and Counseling All adults Screening once a year Counseling if BMI 30 or higher  Today   Tobacco Use Counseling Adults who use tobacco  Up to 8 visits in one year     Vaccines Z23  Hepatitis B  Influenza   Pneumonia  Adults   Once  Once every flu season  Two different vaccines separated by one year     Next Annual Wellness Visit People with Medicare Every year  Today     Services & Screenings Women Who How Often Need  Date of Last Service Action  Mammogram  Z12.31 Women over 51 One baseline ages 24-39. Annually ager 40 yrs+     Pap tests All women Annually if high risk. Every 2 yrs for normal risk women     Screening for cervical cancer with   Pap (Z01.419 nl or Z01.411abnl) &  HPV Z11.51 Women aged 47 to 33 Once every 5 yrs     Screening pelvic and breast exams All women Annually if high risk. Every 2 yrs for normal risk women     Sexually Transmitted Diseases  Chlamydia  Gonorrhea  Syphilis All at risk adults Annually for non pregnant females at increased risk         [place] Men Who How Ofter Need  Date of Last Service Action  Prostate Cancer - DRE & PSA Men over 50 Annually.  DRE might require a copay.     Sexually Transmitted Diseases  Syphilis All at risk adults Annually for men at increased risk       Ask about covid booster please.

## 2020-10-20 IMAGING — CR DG CHEST 1V
1 series · 1 of 1 positions shown · non-contrast
Comparison: November 22, 2017

CLINICAL DATA: Malaise.

EXAM:
CHEST  1 VIEW

[chest ap]
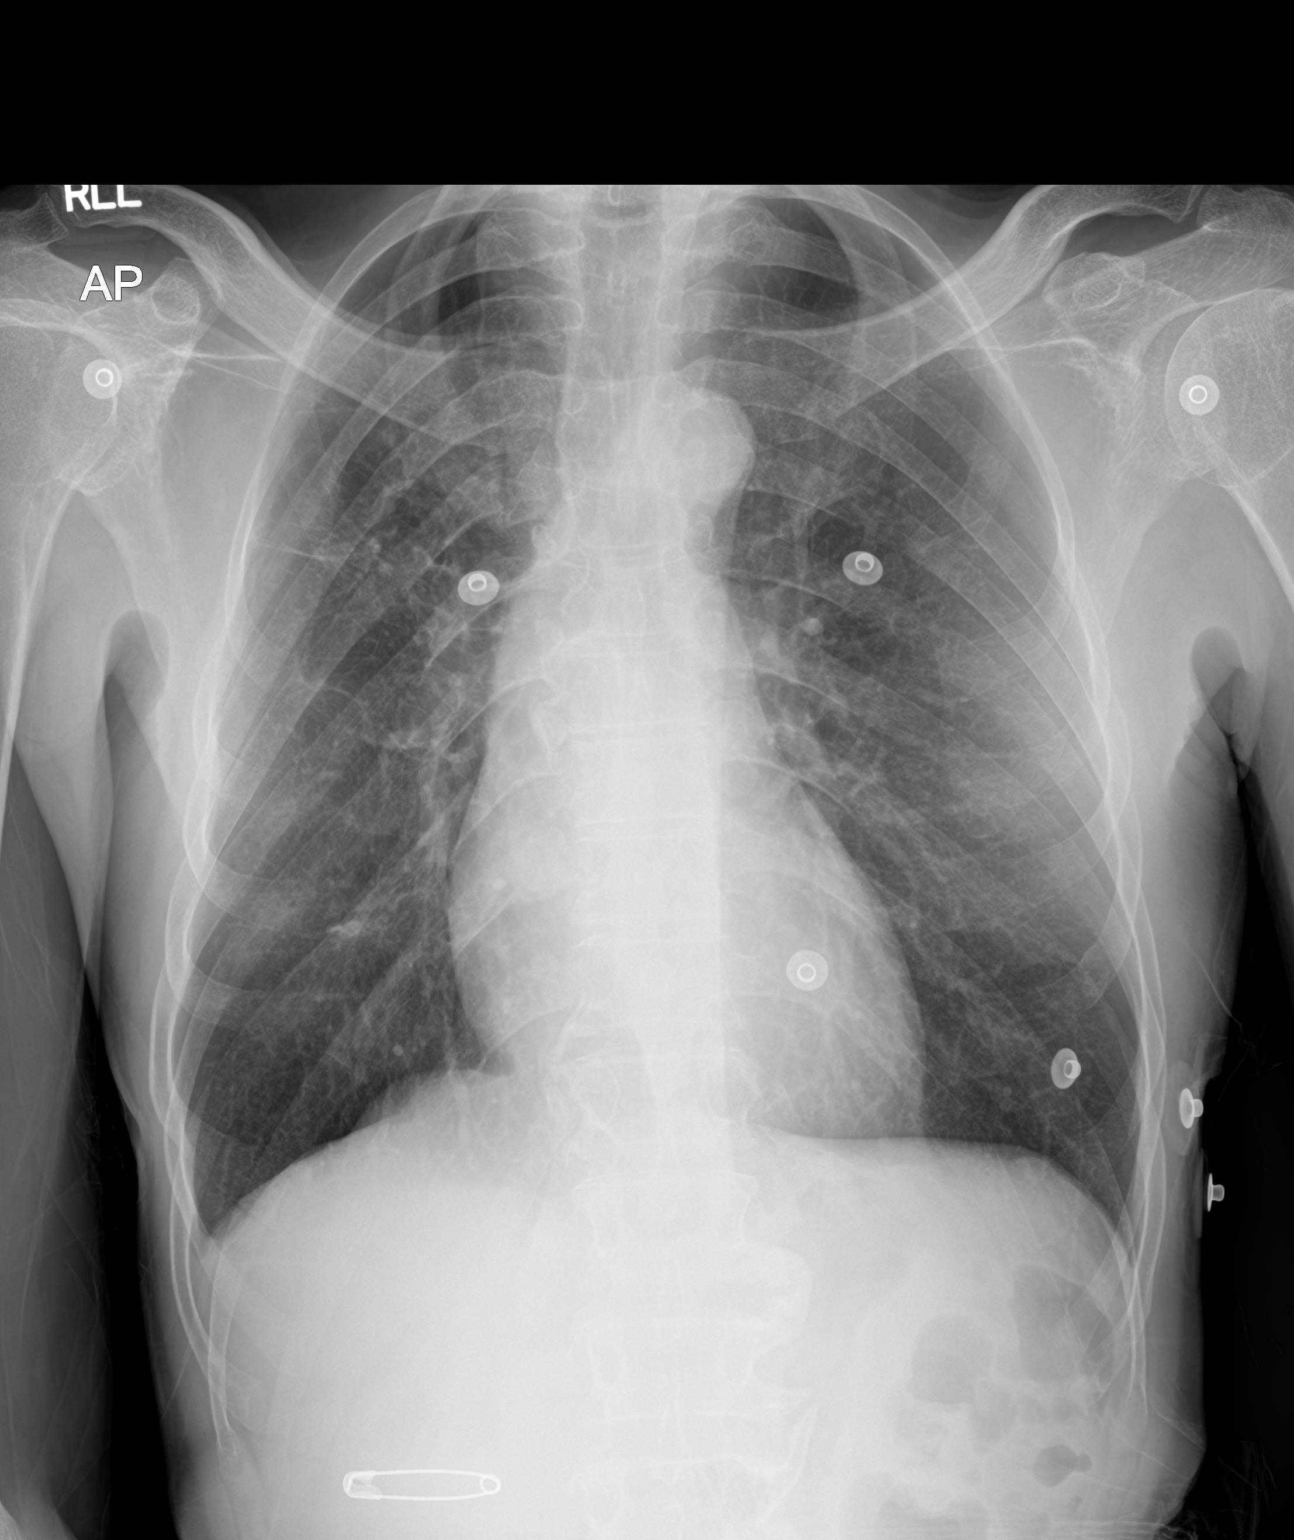

[1 of 1 positions shown; findings below may reference images not displayed]

FINDINGS: The heart size and mediastinal contours are within normal limits.
Both lungs are clear. The visualized skeletal structures are
unremarkable.
IMPRESSION: No active disease.

## 2020-10-20 IMAGING — CT CT HEAD W/O CM
4 series · 17 of 47 positions shown, 19 images · non-contrast
Comparison: MRI dated 10/21/2005.

CLINICAL DATA: Headache.

EXAM:
CT HEAD WITHOUT CONTRAST
TECHNIQUE: Contiguous axial images were obtained from the base of the skull
through the vertex without intravenous contrast.

[Series 3: head wo · axial · 0.41mm/px · z∈[-140,-30]mm · 7 of 30 slices shown, 9 images]
[im 4/30  brain]
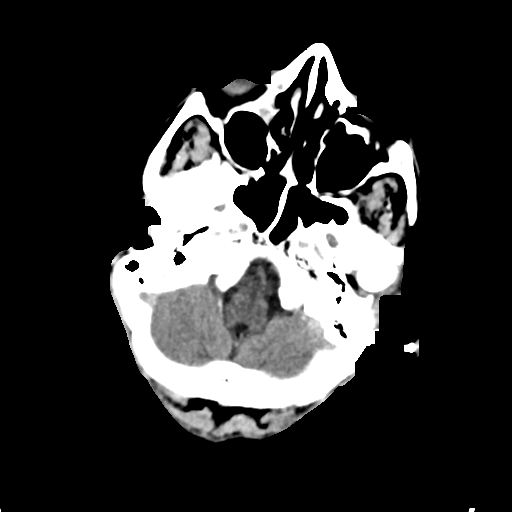
[im 4/30  bone]
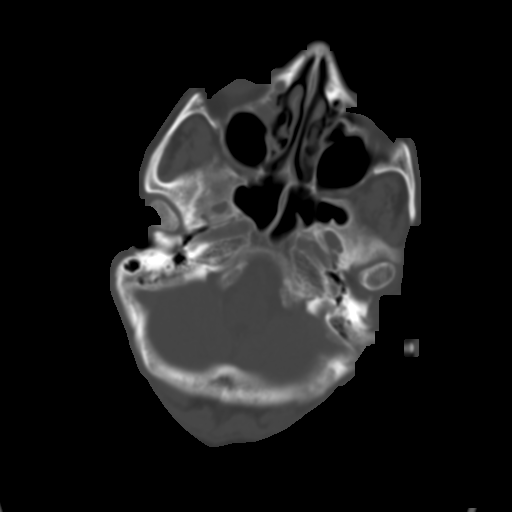
[im 8/30  brain]
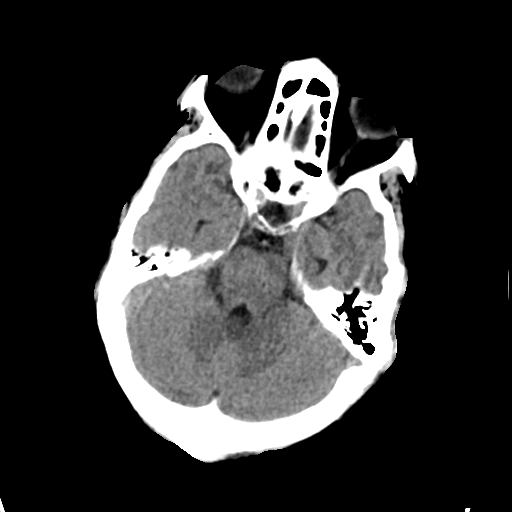
[im 11/30  brain]
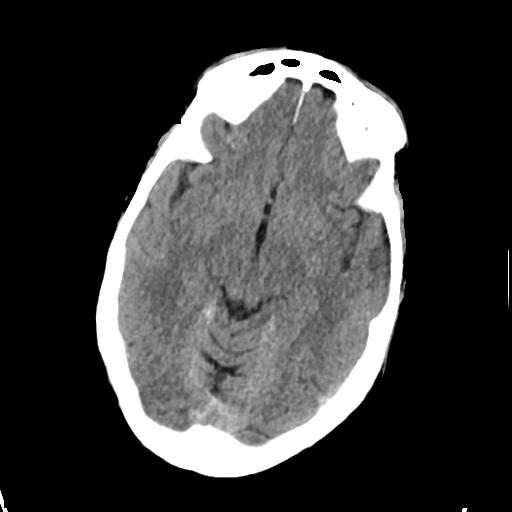
[im 15/30  brain]
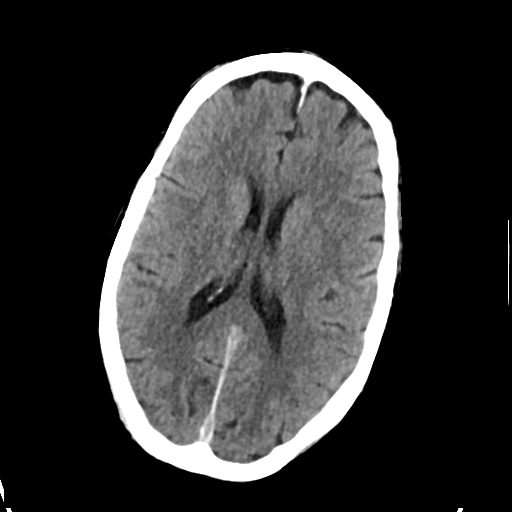
[im 19/30  brain]
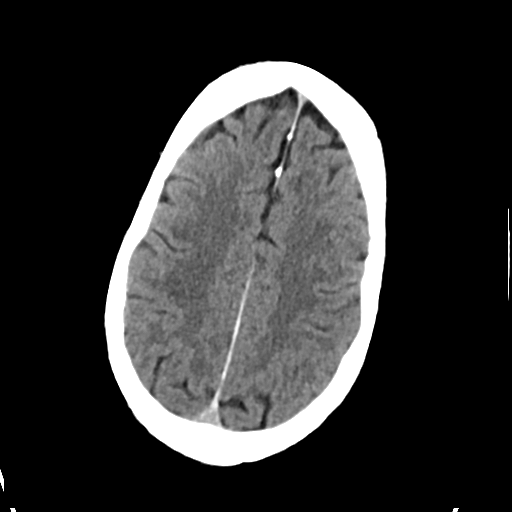
[im 19/30  bone]
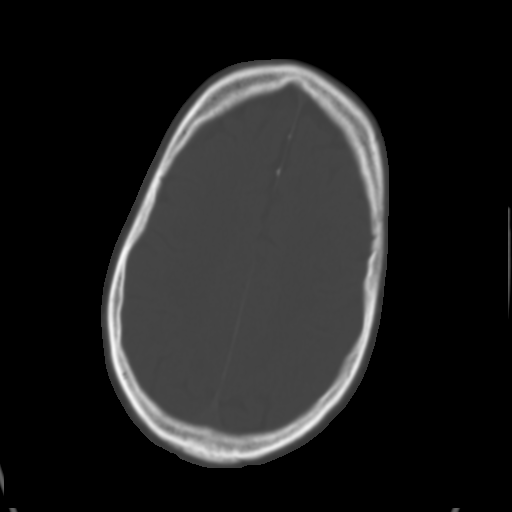
[im 22/30  brain]
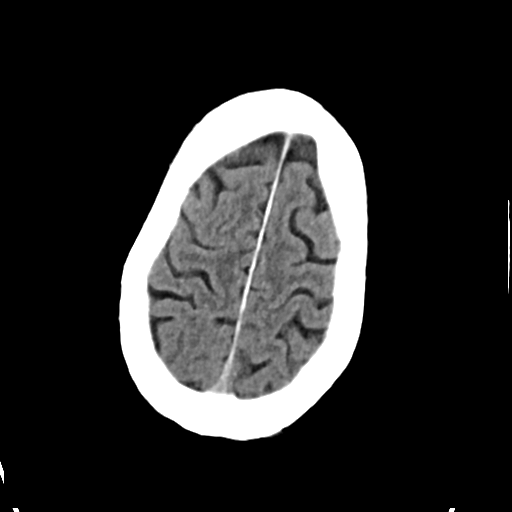
[im 26/30  brain]
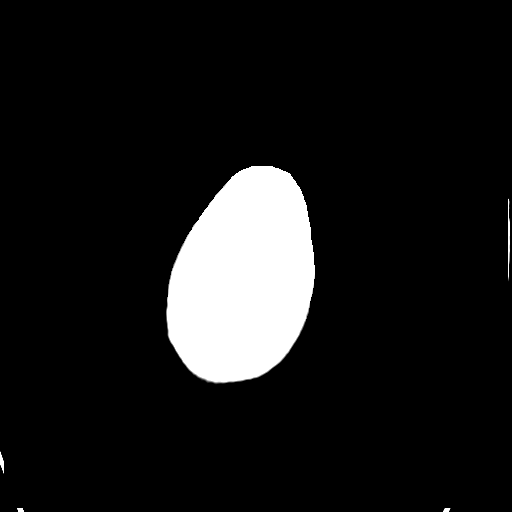

[Series 4: head bone · axial · 0.41mm/px · z∈[-140,-90]mm · 4 of 74 slices shown]
[im 8/74  bone]
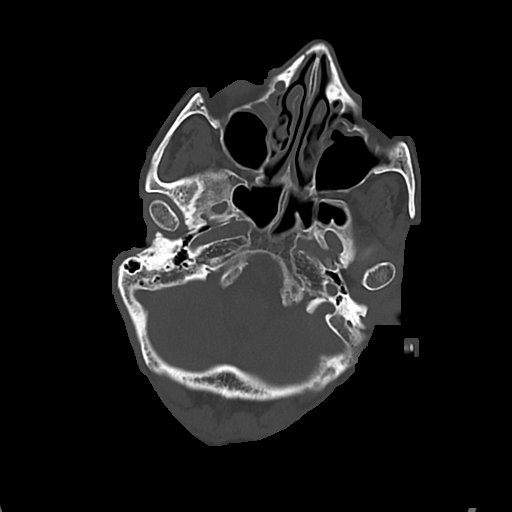
[im 15/74  bone]
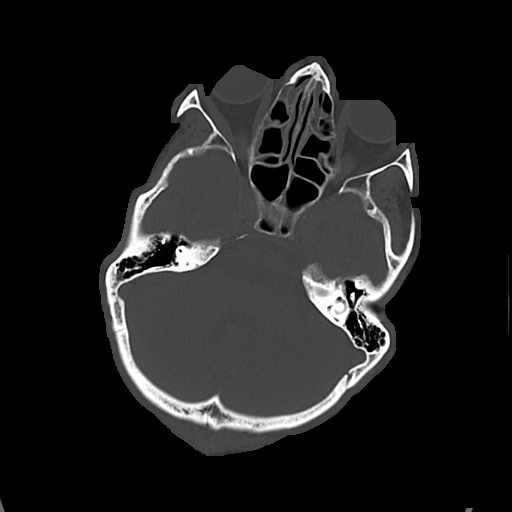
[im 22/74  bone]
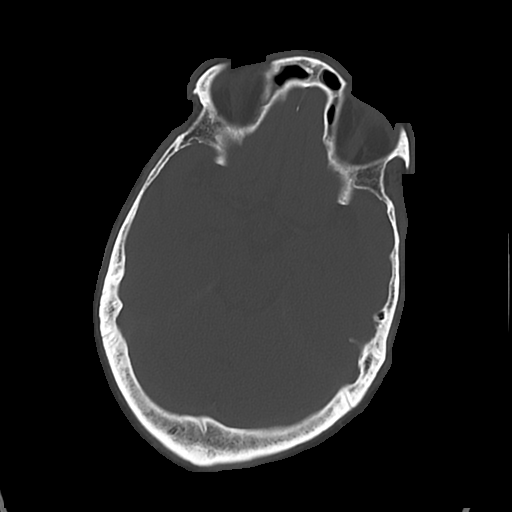
[im 33/74  bone]
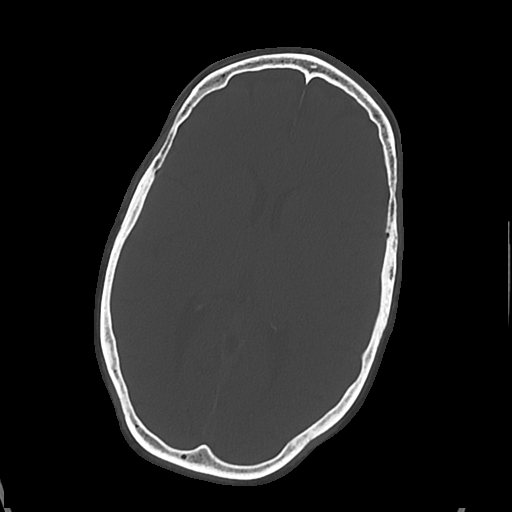

[Series 5: cor soft · coronal · 0.31mm/px · 3 of 67 slices shown]
[im 23/67  brain]
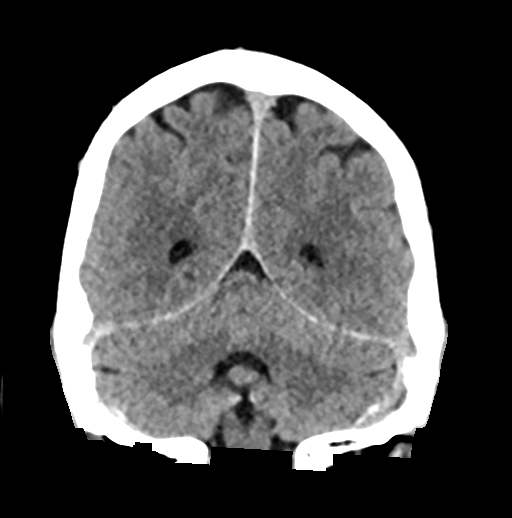
[im 30/67  brain]
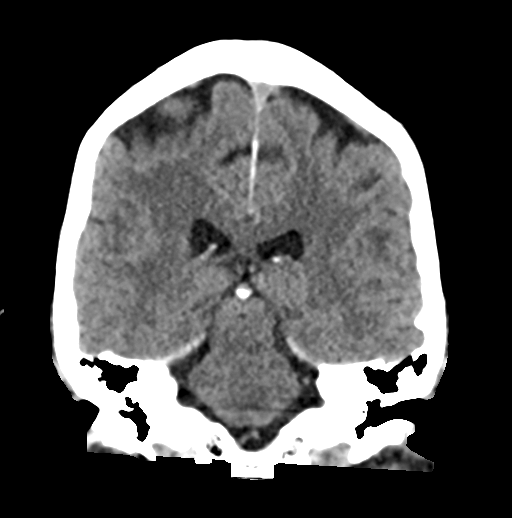
[im 37/67  brain]
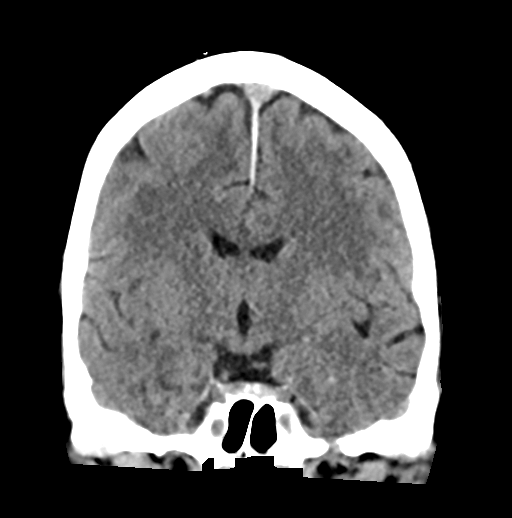

[Series 6: sag soft · sagittal · 0.29mm/px · 3 of 53 slices shown]
[im 18/53  brain]
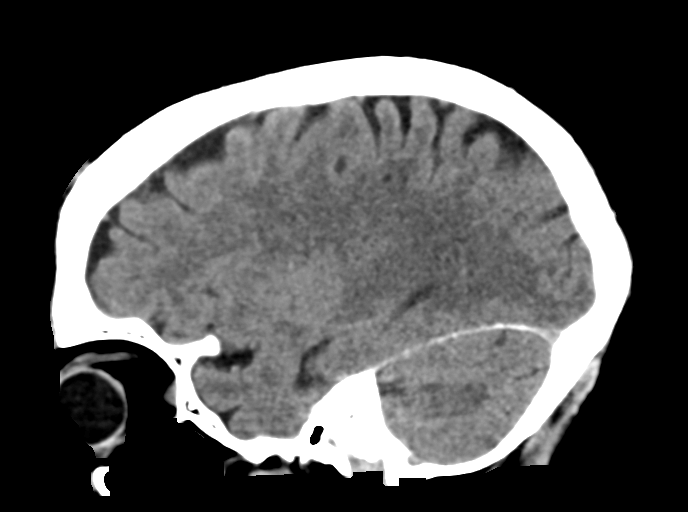
[im 27/53  brain]
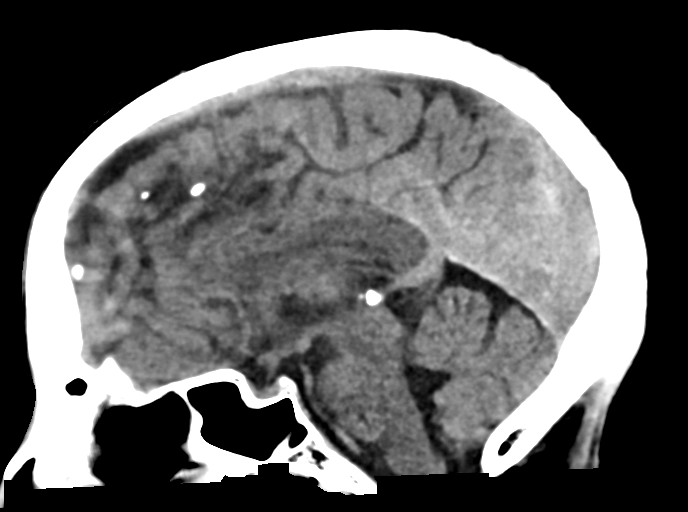
[im 35/53  brain]
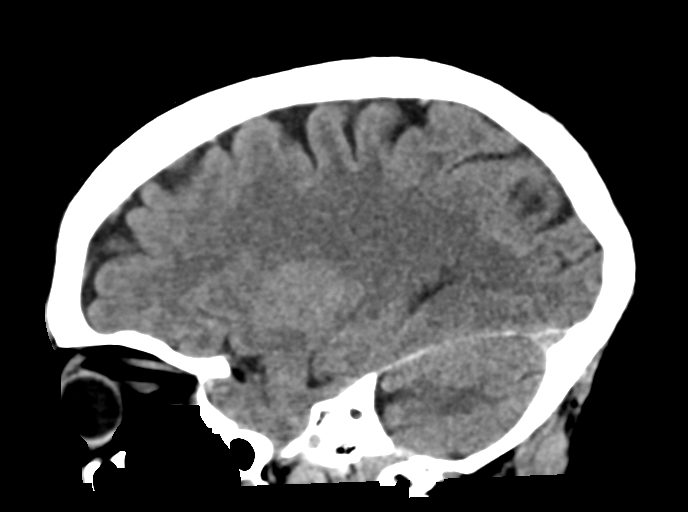

[17 of 47 positions shown; findings below may reference images not displayed]

FINDINGS: Brain: No evidence of acute infarction, hemorrhage, hydrocephalus,
extra-axial collection or mass lesion/mass effect.

Vascular: No hyperdense vessel or unexpected calcification.

Skull: Normal. Negative for fracture or focal lesion.

Sinuses/Orbits: There is mucosal thickening involving the ethmoid
air cells and right maxillary sinus. There is mucosal thickening of
the frontal sinuses. The mastoid air cells are essentially clear.

Other: None.
IMPRESSION: 1. No acute intracranial abnormality.
2. Sinus disease as detailed above.

## 2020-11-11 ENCOUNTER — Encounter: Payer: Medicare HMO | Admitting: Student

## 2020-11-11 ENCOUNTER — Telehealth: Payer: Self-pay

## 2020-11-11 NOTE — Telephone Encounter (Signed)
Patient was told if his knee was better he did not have to come in. Patient is using cream and instructions given to  Him by the doctor Knee is much better Gregory, Hernandez C3/7/20229:38 AM

## 2020-11-15 DIAGNOSIS — H5213 Myopia, bilateral: Secondary | ICD-10-CM | POA: Diagnosis not present

## 2020-11-15 DIAGNOSIS — H2513 Age-related nuclear cataract, bilateral: Secondary | ICD-10-CM | POA: Diagnosis not present

## 2020-11-15 DIAGNOSIS — H524 Presbyopia: Secondary | ICD-10-CM | POA: Diagnosis not present

## 2020-12-09 DIAGNOSIS — H5213 Myopia, bilateral: Secondary | ICD-10-CM | POA: Diagnosis not present

## 2020-12-09 DIAGNOSIS — H524 Presbyopia: Secondary | ICD-10-CM | POA: Diagnosis not present

## 2021-02-10 ENCOUNTER — Ambulatory Visit (INDEPENDENT_AMBULATORY_CARE_PROVIDER_SITE_OTHER): Payer: Medicare HMO | Admitting: Student in an Organized Health Care Education/Training Program

## 2021-02-10 ENCOUNTER — Encounter: Payer: Self-pay | Admitting: Student in an Organized Health Care Education/Training Program

## 2021-02-10 VITALS — BP 127/72 | HR 81 | Temp 98.0°F | Ht 62.0 in | Wt 91.0 lb

## 2021-02-10 DIAGNOSIS — D472 Monoclonal gammopathy: Secondary | ICD-10-CM | POA: Diagnosis not present

## 2021-02-10 DIAGNOSIS — E7800 Pure hypercholesterolemia, unspecified: Secondary | ICD-10-CM | POA: Diagnosis not present

## 2021-02-10 DIAGNOSIS — I10 Essential (primary) hypertension: Secondary | ICD-10-CM

## 2021-02-10 DIAGNOSIS — E0590 Thyrotoxicosis, unspecified without thyrotoxic crisis or storm: Secondary | ICD-10-CM | POA: Diagnosis not present

## 2021-02-10 DIAGNOSIS — F1010 Alcohol abuse, uncomplicated: Secondary | ICD-10-CM | POA: Diagnosis not present

## 2021-02-10 DIAGNOSIS — E041 Nontoxic single thyroid nodule: Secondary | ICD-10-CM | POA: Diagnosis not present

## 2021-02-10 DIAGNOSIS — R634 Abnormal weight loss: Secondary | ICD-10-CM

## 2021-02-10 NOTE — Assessment & Plan Note (Addendum)
Reports eating three meals daily. Has been trying to use an over-the-counter multivitamin for weight gain but reports that it has not been working. Denies decreased energy.   A&P: Underweight patient with relatively stable weight over the past several years. Weight today 91 lb, up from 85 lb one year ago. Low concern for malignancy. Up to date on colorectal cancer screening. Does have thyroid nodule and MGUS, both of which are being monitored annually.  - Cautioned against use of unregulated weight gain supplements

## 2021-02-10 NOTE — Progress Notes (Signed)
Attestation for Student Documentation:  I personally was present and performed or re-performed the history, physical exam and medical decision-making activities of this service and have verified that the service and findings are accurately documented in the student's note.  60 year old person here for follow up and doing well. No recent illness or hospitalization. He enjoys a good functional status at home, is independent in daily activities. We are monitoring MGUS, subclinical hyperthyroidism, low body weight, and a left thyroid nodule with labs and imaging today. Otherwise, the patient seems to be doing well so far this year.  Axel Filler, MD 02/10/2021, 3:40 PM

## 2021-02-10 NOTE — Assessment & Plan Note (Signed)
Endorses occasional difficulty swallowing.  A&P: Enlarged thyroid with palpable left sided nodule. Last ultrasound in 2021 indicative of multinodular goiter and met TI-RADS criteria for annual surveillance. - Repeat thyroid ultrasound

## 2021-02-10 NOTE — Assessment & Plan Note (Signed)
Reports that he has not consumed an alcoholic beverage in 2 weeks. He only drinks occasionally on holidays.   A&P: Improved - Commended patient effort and offered continued support

## 2021-02-10 NOTE — Assessment & Plan Note (Signed)
Denies diarrhea, palpitations, anxiety, or tremors.   A&P: Asymptomatic with historically decreased TSH and T4 within normal limits in the setting of a palpable thyroid nodule on exam.  - Recheck TSH and T4

## 2021-02-10 NOTE — Assessment & Plan Note (Signed)
Reports compliance with pravastatin 20 mg daily without any adverse side effects.  A&P: Well controlled on last lipid panel from 2014 - Continue pravastatin 20 mg daily - Check lipid panel

## 2021-02-10 NOTE — Assessment & Plan Note (Signed)
Reports compliance with chlorthalidone 25 mg daily. Denies any adverse side effects.  A&P: Well controlled, BP today 127/72. - Continue chlorthalidone 25 mg daily - BMP today

## 2021-02-10 NOTE — Patient Instructions (Addendum)
Gregory Hernandez,  It was a pleasure to see you today. We are going to order some blood work and will call you with results. We are also ordering an ultrasound of your thyroid gland and they will call you to schedule.

## 2021-02-10 NOTE — Assessment & Plan Note (Addendum)
History of IgG MGUS with normal CBC, BMP, and calcium.   A&P: MGUS, currently monitored annually with serum IFE and light chains. BMI 16.6 but steady over the past several years with low concern for malignancy. - serum IFE, PE and FLC

## 2021-02-10 NOTE — Progress Notes (Signed)
Subjective:   Patient ID: Gregory Hernandez male   DOB: 03-Aug-1961 60 y.o.   MRN: [id number]  HPI: [name] is a 60 y.o. male with a past medical history as below who presents today for a routine follow up. He reports doing well at home and having no trouble completing his ADLs or IADLs. He is wondering about a multivitamin for weight gain.   Please see problem based charting for more details.   Past Medical History:  Diagnosis Date  . Adenomatous colon polyp 12/14/2011  . Dermatitis    H/O Tinea dermatitis  . Elevated BP   . Elevated transaminase level    Mild elevation.  . Facial pain 2007   Left eye and left facial pain January 2007; evaluated by ophthalmologist Dr. Ishmael Holter; CT of face and orbits 09/16/2005 unremarkable; etiology unclear; improved by 10/28/2005.  . Fracture of left shoulder 2012  . Helicobacter pylori gastritis 11/16/2014   EGD done 11/01/2014 by Dr. Michail Sermon to work up weight loss showed gastritis; biopsy showed chronic active gastritis with Helicobacter pylori organisms.  Dr. Kathline Magic notes indicate plan to start treatment with Prevpac.   Marland Kitchen Rhinitis    Seasonal  . TB (tuberculosis) 1996   Pulmonary TB (right upper lobe infiltrate) diagnosed May 1996; treated at [facility]  . Tobacco abuse    Current Outpatient Medications  Medication Sig Dispense Refill  . chlorthalidone (HYGROTON) 25 MG tablet Take 1 tablet (25 mg total) by mouth daily. 90 tablet 3  . diclofenac Sodium (VOLTAREN) 1 % GEL Apply 2 g topically 4 (four) times daily. 100 g 0  . Nutritional Supplements (ENSURE HIGH PROTEIN) POWD Take 771 g by mouth daily. 771 g 1  . pravastatin (PRAVACHOL) 20 MG tablet TAKE 1 TABLET BY MOUTH EVERY DAY IN THE EVENING 90 tablet 3   No current facility-administered medications for this visit.   Family History  Problem Relation Age of Onset  . Hypertension Mother   . Diabetes Mother   . Heart attack Mother   . Peptic Ulcer Mother   .  Arthritis Mother   . Cancer Cousin    Social History   Socioeconomic History  . Marital status: Single    Spouse name: Not on file  . Number of children: Not on file  . Years of education: Not on file  . Highest education level: Not on file  Occupational History  . Not on file  Tobacco Use  . Smoking status: Former Smoker    Packs/day: 2.00    Years: 7.00    Pack years: 14.00    Types: Cigarettes    Quit date: 08/18/2009    Years since quitting: 11.4  . Smokeless tobacco: Never Used  Substance and Sexual Activity  . Alcohol use: No    Alcohol/week: 0.0 standard drinks    Comment: About 2-3 beers per day. 2 40 ounces a day  . Drug use: No  . Sexual activity: Not Currently  Other Topics Concern  . Not on file  Social History Narrative  . Not on file   Social Determinants of Health   Financial Resource Strain: Not on file  Food Insecurity: Not on file  Transportation Needs: Not on file  Physical Activity: Not on file  Stress: Not on file  Social Connections: Not on file   Review of Systems: Pertinent items noted in HPI and remainder of comprehensive ROS otherwise negative. Objective:  Physical Exam: Vitals:   02/10/21  1048  BP: 127/72  Pulse: 81  Temp: 98 F (36.7 C)  TempSrc: Oral  SpO2: 100%  Weight: 91 lb (41.3 kg)  Height: 5\' 2"  (1.575 m)   General: Well appearing and in no acute distress, appears stated age Neuro: A&O x4, normal affect HEENT: Normocephalic, atraumatic, PERRLA, EOMI, normal sclerae without scleral icterus or injection. Moist mucous membranes. Clear oropharynx. Goiter with palpable left sided nodule Cardiovascular: RRR, no m/r/g. Normal S1 and S2 without S3 or S4. Pulses 2+ in bilateral UE and LE. No peripheral edema Pulmonary: CTAB, no wheezes, rhonchi or rales. Normal WOB, no clubbing  Abdominal: Abdomen soft and non-distended. Normal bowel sounds in all four quadrants. No tenderness to palpation, guarding, or rebound tenderness Skin:  Warm and dry with no rashes, cuts, or bruises MSK: Normal ROM of all extremities. Strength 5/5 in bilateral UE and LE  Assessment & Plan:  Please see problem based charting for more details.

## 2021-02-13 LAB — BMP8+ANION GAP
Anion Gap: 22 mmol/L — ABNORMAL HIGH (ref 10.0–18.0)
BUN/Creatinine Ratio: 11 (ref 10–24)
BUN: 7 mg/dL — ABNORMAL LOW (ref 8–27)
CO2: 22 mmol/L (ref 20–29)
Calcium: 10.3 mg/dL — ABNORMAL HIGH (ref 8.6–10.2)
Chloride: 96 mmol/L (ref 96–106)
Creatinine, Ser: 0.61 mg/dL — ABNORMAL LOW (ref 0.76–1.27)
Glucose: 91 mg/dL (ref 65–99)
Potassium: 3.8 mmol/L (ref 3.5–5.2)
Sodium: 140 mmol/L (ref 134–144)
eGFR: 109 mL/min/{1.73_m2} (ref >59)

## 2021-02-13 LAB — IFE, PE AND FLC, SERUM
Albumin SerPl Elph-Mcnc: 4.3 g/dL (ref 2.9–4.4)
Albumin/Glob SerPl: 1.3 (ref 0.7–1.7)
Alpha 1: 0.2 g/dL (ref 0.0–0.4)
Alpha2 Glob SerPl Elph-Mcnc: 0.6 g/dL (ref 0.4–1.0)
B-Globulin SerPl Elph-Mcnc: 1.1 g/dL (ref 0.7–1.3)
Gamma Glob SerPl Elph-Mcnc: 1.6 g/dL (ref 0.4–1.8)
Globulin, Total: 3.4 g/dL (ref 2.2–3.9)
Ig Kappa Free Light Chain: 27 mg/L — ABNORMAL HIGH (ref 3.3–19.4)
Ig Lambda Free Light Chain: 12 mg/L (ref 5.7–26.3)
IgA/Immunoglobulin A, Serum: 346 mg/dL (ref 61–437)
IgG (Immunoglobin G), Serum: 1667 mg/dL — ABNORMAL HIGH (ref 603–1613)
IgM (Immunoglobulin M), Srm: 64 mg/dL (ref 20–172)
KAPPA/LAMBDA RATIO: 2.25 — ABNORMAL HIGH (ref 0.26–1.65)
M Protein SerPl Elph-Mcnc: 1 g/dL — ABNORMAL HIGH
Total Protein: 7.7 g/dL (ref 6.0–8.5)

## 2021-02-13 LAB — LIPID PANEL
Chol/HDL Ratio: 2.7 ratio (ref 0.0–5.0)
Cholesterol, Total: 181 mg/dL (ref 100–199)
HDL: 68 mg/dL (ref >39)
LDL Chol Calc (NIH): 98 mg/dL (ref 0–99)
Triglycerides: 83 mg/dL (ref 0–149)
VLDL Cholesterol Cal: 15 mg/dL (ref 5–40)

## 2021-02-13 LAB — T4, FREE: Free T4: 1.11 ng/dL (ref 0.82–1.77)

## 2021-02-13 LAB — TSH: TSH: 0.461 u[IU]/mL (ref 0.450–4.500)

## 2021-02-24 ENCOUNTER — Encounter: Payer: Medicare HMO | Admitting: Student in an Organized Health Care Education/Training Program

## 2021-02-26 ENCOUNTER — Other Ambulatory Visit: Payer: Self-pay | Admitting: Student in an Organized Health Care Education/Training Program

## 2021-03-17 ENCOUNTER — Ambulatory Visit (HOSPITAL_COMMUNITY)
Admission: RE | Admit: 2021-03-17 | Discharge: 2021-03-17 | Disposition: A | Discharge status: Home / Self Care | Payer: Medicare HMO | Source: Ambulatory Visit | Attending: Student in an Organized Health Care Education/Training Program | Admitting: Student in an Organized Health Care Education/Training Program

## 2021-03-17 ENCOUNTER — Other Ambulatory Visit: Payer: Self-pay

## 2021-03-17 DIAGNOSIS — E041 Nontoxic single thyroid nodule: Secondary | ICD-10-CM | POA: Diagnosis not present

## 2021-03-24 ENCOUNTER — Telehealth: Payer: Self-pay | Admitting: Student in an Organized Health Care Education/Training Program

## 2021-03-24 DIAGNOSIS — E041 Nontoxic single thyroid nodule: Secondary | ICD-10-CM

## 2021-03-24 NOTE — Telephone Encounter (Signed)
I spoke with the patient and his mother about the results of recent thyroid ultrasound. He has goiter with nodules, we have been doing surveillance monitoring. Nodules on the left has grown and changed, now TiRads 3, recommended for FNA biopsy. I spoke with them about options, they want to pursue biopsy. I will place the order.

## 2021-04-08 ENCOUNTER — Ambulatory Visit
Admission: RE | Admit: 2021-04-08 | Discharge: 2021-04-08 | Disposition: A | Discharge status: Home / Self Care | Payer: Medicare HMO | Source: Ambulatory Visit | Attending: Student in an Organized Health Care Education/Training Program | Admitting: Student in an Organized Health Care Education/Training Program

## 2021-04-08 ENCOUNTER — Other Ambulatory Visit (HOSPITAL_COMMUNITY)
Admission: RE | Admit: 2021-04-08 | Discharge: 2021-04-08 | Disposition: A | Discharge status: Home / Self Care | Payer: Medicare HMO | Source: Ambulatory Visit | Attending: Physician Assistant | Admitting: Physician Assistant

## 2021-04-08 ENCOUNTER — Other Ambulatory Visit: Payer: Self-pay

## 2021-04-08 DIAGNOSIS — E041 Nontoxic single thyroid nodule: Secondary | ICD-10-CM

## 2021-04-08 DIAGNOSIS — D34 Benign neoplasm of thyroid gland: Secondary | ICD-10-CM | POA: Diagnosis not present

## 2021-04-10 LAB — CYTOLOGY - NON PAP

## 2021-05-16 ENCOUNTER — Telehealth: Payer: Self-pay

## 2021-05-16 NOTE — Telephone Encounter (Signed)
Pt's mother requesting to speak with a nurse about big red spot on top of his head. Please call back.

## 2021-05-16 NOTE — Telephone Encounter (Signed)
Return call to pt's mother. Stated she noticed pt scratching his head. She noticed he has a reddened area on top of his head also hair loss. Appt schedule w/Dr [facility] Monday 9/12 @ 1530 PM. Instructed to take pt to UC if she notice a change and/or increase swollen- stated she understands.

## 2021-05-19 ENCOUNTER — Ambulatory Visit (INDEPENDENT_AMBULATORY_CARE_PROVIDER_SITE_OTHER): Payer: Medicare HMO | Admitting: Internal Medicine

## 2021-05-19 ENCOUNTER — Encounter: Payer: Self-pay | Admitting: Internal Medicine

## 2021-05-19 ENCOUNTER — Other Ambulatory Visit: Payer: Self-pay

## 2021-05-19 VITALS — BP 134/79 | HR 72 | Temp 97.5°F | Ht 63.0 in | Wt 95.0 lb

## 2021-05-19 DIAGNOSIS — L989 Disorder of the skin and subcutaneous tissue, unspecified: Secondary | ICD-10-CM | POA: Diagnosis not present

## 2021-05-19 DIAGNOSIS — Z23 Encounter for immunization: Secondary | ICD-10-CM | POA: Diagnosis not present

## 2021-05-19 NOTE — Patient Instructions (Signed)
Dear Mr. Aloia,  Today we evaluated you for a bump on your head. The bump appears to be nearly resolved. If it returns, please use a warm, wet washcloth to help. If that does not resolve the problem, you may reschedule an appointment with [place] to evaluate it then.

## 2021-05-19 NOTE — Progress Notes (Signed)
   CC: Red spot on head  HPI:[name] is a 60 y.o. male who presents for evaluation of skin abnormality. Please see individual problem based A/P for details.  Please see encounters tab for problem-based charting.  Problem List Items Addressed This Visit       Musculoskeletal and Integument   Skin abnormality    Patient reported raised slightly pruritic skin abnormality on his scalp.  On exam there does not appear to be any edema and no change in skin color.  No abnormality can be palpated.  There is some slight hair loss and minimal flaking of skin.  Location and appearance not consistent with MGUS cutaneous manifestations.  Overall the lesion appears to have resolved on its own.  No treatment necessary at this time.  No further work-up indicated.      Other Visit Diagnoses     Need for immunization against influenza    -  Primary   Relevant Orders   Flu Vaccine QUAD 8moIM (Fluarix, Fluzone & Alfiuria Quad PF) (Completed)        Depression, PHQ-9: [place] the [place] PHQ-9 Total Score 1      score we have 1.  Past Medical History:  Diagnosis Date   Adenomatous colon polyp 12/14/2011   Dermatitis    H/O Tinea dermatitis   Elevated BP    Elevated transaminase level    Mild elevation.   Facial pain 2007   Left eye and left facial pain January 2007; evaluated by ophthalmologist Dr. JIshmael Holter CT of face and orbits 09/16/2005 unremarkable; etiology unclear; improved by 10/28/2005.   Fracture of left shoulder 20000000  Helicobacter pylori gastritis 11/16/2014   EGD done 11/01/2014 by Dr. SMichail Sermonto work up weight loss showed gastritis; biopsy showed chronic active gastritis with Helicobacter pylori organisms.  Dr. SKathline Magicnotes indicate plan to start treatment with Prevpac.    Rhinitis    Seasonal   TB (tuberculosis) 1996   Pulmonary [facility] (right upper lobe infiltrate) diagnosed May 1996;  [facility]   Tobacco abuse    Review of Systems:   Review of Systems  Constitutional: Negative.   HENT: Negative.    Eyes: Negative.   Respiratory: Negative.    Cardiovascular: Negative.   Gastrointestinal: Negative.   Genitourinary: Negative.   Musculoskeletal: Negative.   Neurological: Negative.   Psychiatric/Behavioral: Negative.      Physical Exam: Vitals:   05/19/21 1538  BP: 134/79  Pulse: 72  Temp: (!) 97.5 F (36.4 C)  TempSrc: Oral  SpO2: 100%  Weight: 95 lb (43.1 kg)  Height: '5\' 3"'$  (1.6 m)     General: Alert and oriented no acute distress. HEENT: Conjunctiva nl , antiicteric sclerae, moist mucous membranes, no exudate or erythema.  Small area on scalp with some hair loss.  Minimal skin flaking.  No edema erythema or drainage noted. Cardiovascular: Normal rate, regular rhythm.  No murmurs, rubs, or gallops Pulmonary : Equal breath sounds, No wheezes, rales, or rhonchi Abdominal: soft, nontender,  bowel sounds present Ext: No edema in lower extremities, no tenderness to palpation of lower extremities.   Assessment & Plan:   See Encounters Tab for problem based charting.  Patient seen with Dr. WJimmye Norman

## 2021-05-20 ENCOUNTER — Encounter: Payer: Self-pay | Admitting: Internal Medicine

## 2021-05-20 NOTE — Assessment & Plan Note (Addendum)
Patient reported raised slightly pruritic skin abnormality on his scalp.  On exam there does not appear to be any edema and no change in skin color.  No abnormality can be palpated.  There is some slight hair loss and minimal flaking of skin.  Location and appearance not consistent with MGUS cutaneous manifestations.  Overall the lesion appears to have resolved on its own.  No treatment necessary at this time.  Advised patient that if the abnormality returns to apply warm wet washcloth to the area.  If that does not resolve it we can reevaluate in the office.

## 2021-11-27 DIAGNOSIS — H2513 Age-related nuclear cataract, bilateral: Secondary | ICD-10-CM | POA: Diagnosis not present

## 2021-11-27 DIAGNOSIS — H524 Presbyopia: Secondary | ICD-10-CM | POA: Diagnosis not present

## 2021-11-27 DIAGNOSIS — H5213 Myopia, bilateral: Secondary | ICD-10-CM | POA: Diagnosis not present

## 2021-12-24 ENCOUNTER — Encounter: Payer: Self-pay | Admitting: Internal Medicine

## 2021-12-24 ENCOUNTER — Ambulatory Visit (HOSPITAL_COMMUNITY)
Admission: RE | Admit: 2021-12-24 | Discharge: 2021-12-24 | Disposition: A | Discharge status: Home / Self Care | Payer: Medicare HMO | Source: Ambulatory Visit | Attending: Internal Medicine | Admitting: Internal Medicine

## 2021-12-24 ENCOUNTER — Ambulatory Visit (INDEPENDENT_AMBULATORY_CARE_PROVIDER_SITE_OTHER): Payer: Medicare HMO | Admitting: Internal Medicine

## 2021-12-24 VITALS — BP 135/70 | HR 67 | Temp 97.9°F | Ht 63.0 in | Wt 96.2 lb

## 2021-12-24 DIAGNOSIS — S62667A Nondisplaced fracture of distal phalanx of left little finger, initial encounter for closed fracture: Secondary | ICD-10-CM | POA: Diagnosis not present

## 2021-12-24 DIAGNOSIS — M25542 Pain in joints of left hand: Secondary | ICD-10-CM

## 2021-12-24 DIAGNOSIS — M7989 Other specified soft tissue disorders: Secondary | ICD-10-CM | POA: Diagnosis not present

## 2021-12-24 DIAGNOSIS — I10 Essential (primary) hypertension: Secondary | ICD-10-CM | POA: Diagnosis not present

## 2021-12-24 DIAGNOSIS — M79645 Pain in left finger(s): Secondary | ICD-10-CM

## 2021-12-24 NOTE — Patient Instructions (Addendum)
Gregory Hernandez, ? ?It was a pleasure seeing you in clinic. Today we discussed:  ? ?Left finger swelling/pain:  ?We will obtain an x-ray. I will call you with the results and further steps. ?Meanwhile, you may continue to rest, ice as needed and tylenol as needed for pain control.  ? ?If you have any questions or concerns, please call our clinic at [phone] between 9am-5pm and after hours call [phone] and ask for the internal medicine resident on call. If you feel you are having a medical emergency please call 911.  ? ?Thank you, we look forward to helping you remain healthy! ? ? ?

## 2021-12-24 NOTE — Progress Notes (Signed)
? ?  CC: left fifth finger pain and swelling  ? ?HPI: ? ?[name] is a 61 y.o. male with PMHx as stated below presenting for evaluation of left fifth finger pain and swelling for one week duration. Please see problem based charting for complete assessment and plan. ? ?Past Medical History:  ?Diagnosis Date  ? Adenomatous colon polyp 12/14/2011  ? Dermatitis   ? H/O Tinea dermatitis  ? Elevated BP   ? Elevated transaminase level   ? Mild elevation.  ? Facial pain 2007  ? Left eye and left facial pain January 2007; evaluated by ophthalmologist Dr. Ishmael Hernandez; CT of face and orbits 09/16/2005 unremarkable; etiology unclear; improved by 10/28/2005.  ? Fracture of left shoulder 2012  ? Helicobacter pylori gastritis 11/16/2014  ? EGD done 11/01/2014 by Dr. Michail Hernandez to work up weight loss showed gastritis; biopsy showed chronic active gastritis with Helicobacter pylori organisms.  Dr. Kathline Hernandez notes indicate plan to start treatment with Prevpac.   ? Rhinitis   ? Seasonal  ? TB (tuberculosis) 1996  ? Pulmonary TB (right upper lobe infiltrate) diagnosed May 1996; treated at [facility]  ? Tobacco abuse   ? ?Review of Systems:  Negative except as stated in HPI. ? ?Physical Exam: ? ?Vitals:  ? 12/24/21 0901  ?BP: (!) 155/108  ?Pulse: 80  ?Temp: 97.9 ?F (36.6 ?C)  ?TempSrc: Oral  ?SpO2: 99%  ?Weight: 96 lb 3.2 oz (43.6 kg)  ?Height: '5\' 3"'$  (1.6 m)  ? ?Physical Exam  ?Constitutional: Appears well-developed and well-nourished. No distress.  ?Cardiovascular: Normal rate, regular rhythm,  Distal pulses intact ?Respiratory:  Effort is normal on room air  ?Musculoskeletal: Normal bulk and tone.   ?Hand: Isolated swelling and mild-moderate tenderness at the left fifth digit DIP joint. No erythema or warmth. Decreased ROM of the DIP joint, otherwise full ROM of PIP and MTP joints. Strength and neurovascularly intact.  ?Skin: Warm and dry.  No rash, erythema, lesions noted. ? ?Assessment & Plan:  ? ?See Encounters  Tab for problem based charting. ? ?Patient discussed with Dr. Dareen Hernandez ? ?

## 2021-12-25 DIAGNOSIS — M79645 Pain in left finger(s): Secondary | ICD-10-CM | POA: Insufficient documentation

## 2021-12-25 NOTE — Assessment & Plan Note (Addendum)
Patient is presenting for one week history of left fifth digit pain and swelling. He denies any trauma to the area. He does not have a history of gout. On examination, he has significant swelling of the left fifth digit DIP joint with restricted ROM. Mild to moderate tenderness on palpation.  ?X-ray obtained that was significant for acute nondisplaced, intra-articular fracture of the DIP joint. Discussed x-ray results with patient and his mother. Although patient denies trauma, mother reports suspicion for possible fall resulting in this fracture. Recommended for buddy taping and pain control with tylenol. Will also place referral to orthopedics. Patient and mother express understanding. ?

## 2021-12-25 NOTE — Assessment & Plan Note (Signed)
BP Readings from Last 3 Encounters:  ?12/24/21 135/70  ?05/19/21 134/79  ?02/10/21 127/72  ? ?Patient's blood pressure is well controlled on current regimen of chlorthalidone '25mg'$  daily. He reports medication compliance and denies any side effects. ? ?Plan: ?Continue current regimen ?

## 2021-12-26 NOTE — Progress Notes (Signed)
Internal Medicine Clinic Attending ° °Case discussed with Dr. Aslam  At the time of the visit.  We reviewed the resident’s history and exam and pertinent patient test results.  I agree with the assessment, diagnosis, and plan of care documented in the resident’s note.  °

## 2021-12-30 ENCOUNTER — Telehealth: Payer: Self-pay

## 2021-12-30 NOTE — Telephone Encounter (Signed)
Patient called regarding a referral to ortho. Which office was he referred to. ?

## 2021-12-31 NOTE — Telephone Encounter (Signed)
Thank you :)

## 2021-12-31 NOTE — Telephone Encounter (Signed)
I'm not sure which office has accepted his referral - Gregory Hernandez, would you be able to help with this please?

## 2022-01-02 DIAGNOSIS — M20012 Mallet finger of left finger(s): Secondary | ICD-10-CM | POA: Diagnosis not present

## 2022-02-11 DIAGNOSIS — M20012 Mallet finger of left finger(s): Secondary | ICD-10-CM | POA: Diagnosis not present

## 2022-02-28 ENCOUNTER — Other Ambulatory Visit: Payer: Self-pay | Admitting: Student in an Organized Health Care Education/Training Program

## 2022-08-03 ENCOUNTER — Encounter: Payer: Self-pay | Admitting: Student in an Organized Health Care Education/Training Program

## 2022-08-03 ENCOUNTER — Encounter: Payer: Medicare HMO | Admitting: Student in an Organized Health Care Education/Training Program

## 2022-12-23 ENCOUNTER — Encounter: Payer: Medicare HMO | Admitting: Student

## 2023-01-01 ENCOUNTER — Ambulatory Visit (INDEPENDENT_AMBULATORY_CARE_PROVIDER_SITE_OTHER): Payer: Medicare HMO | Admitting: Student

## 2023-01-01 VITALS — BP 124/69 | HR 88 | Wt 96.4 lb

## 2023-01-01 DIAGNOSIS — E041 Nontoxic single thyroid nodule: Secondary | ICD-10-CM | POA: Diagnosis not present

## 2023-01-01 DIAGNOSIS — E0590 Thyrotoxicosis, unspecified without thyrotoxic crisis or storm: Secondary | ICD-10-CM

## 2023-01-01 DIAGNOSIS — I10 Essential (primary) hypertension: Secondary | ICD-10-CM

## 2023-01-01 DIAGNOSIS — M1711 Unilateral primary osteoarthritis, right knee: Secondary | ICD-10-CM | POA: Diagnosis not present

## 2023-01-01 DIAGNOSIS — D472 Monoclonal gammopathy: Secondary | ICD-10-CM | POA: Diagnosis not present

## 2023-01-01 DIAGNOSIS — E7800 Pure hypercholesterolemia, unspecified: Secondary | ICD-10-CM

## 2023-01-01 MED ORDER — DICLOFENAC SODIUM 1 % EX GEL: 2.0000 g | Freq: Four times a day (QID) | CUTANEOUS | 0 refills | Status: DC

## 2023-01-01 NOTE — Assessment & Plan Note (Signed)
On pravastain 20mg  daily, tolerating well. Lipid panel last year with LDL 98. Will repeat lipid panel today.

## 2023-01-01 NOTE — Assessment & Plan Note (Addendum)
History of IgG MGUS. Did have mild elevation in IgG level at last check along with mild hypercalcemia. Otherwise, normal BMP last year.   Obtains annual serum IFE with light chains so will repeat today. Weight is stable. Will reassess calcium level and protein gap with CMP.  Plan: -f/u serum IFE, PE, FLC -f/u CMP

## 2023-01-01 NOTE — Assessment & Plan Note (Signed)
Thyroid studies last year were unremarkable. He does have a thyroid nodule which was found to be benign follicular nodule. Asymptomatic currently and weight is stable. Will repeat thyroid studies today to ensure stability.  -f/u TSH, free T4

## 2023-01-01 NOTE — Progress Notes (Signed)
   CC: f/u HTN, MGUS, HLD, subclinical hyperthyroidism  HPI:  Gregory Hernandez is a 62 y.o. male with history listed below presenting to the [facility] for f/u HTN, MGUS, HLD, subclinical hyperthryoidism. Please see individualized problem based charting for full HPI.  Past Medical History:  Diagnosis Date   Adenomatous colon polyp 12/14/2011   Dermatitis    H/O Tinea dermatitis   Elevated BP    Elevated transaminase level    Mild elevation.   Facial pain 2007   Left eye and left facial pain January 2007; evaluated by ophthalmologist Dr. Cecilie Kicks; CT of face and orbits 09/16/2005 unremarkable; etiology unclear; improved by 10/28/2005.   Fracture of left shoulder 2012   Helicobacter pylori gastritis 11/16/2014   EGD done 11/01/2014 by Dr. 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  Dr. Marge Duncans notes indicate plan to start treatment with Prevpac.    Rhinitis    Seasonal   TB (tuberculosis) 1996   Pulmonary TB (right upper lobe infiltrate) diagnosed May 1996; treated at [facility]   Tobacco abuse     Review of Systems:  Negative aside from that listed in individualized problem based charting.  Physical Exam:  Vitals:   01/01/23 1016  BP: 124/69  Pulse: 88  SpO2: 100%  Weight: 96 lb 6.4 oz (43.7 kg)   Physical Exam Constitutional:      Appearance: Normal appearance. He is not ill-appearing.     Comments: thin  HENT:     Mouth/Throat:     Mouth: Mucous membranes are moist.     Pharynx: Oropharynx is clear. No oropharyngeal exudate.  Eyes:     General: No scleral icterus.    Extraocular Movements: Extraocular movements intact.     Conjunctiva/sclera: Conjunctivae normal.     Pupils: Pupils are equal, round, and reactive to light.  Cardiovascular:     Rate and Rhythm: Normal rate and regular rhythm.     Heart sounds: Normal heart sounds. No murmur heard.    No friction rub. No  gallop.  Pulmonary:     Effort: Pulmonary effort is normal.     Breath sounds: Normal breath sounds. No wheezing, rhonchi or rales.  Abdominal:     General: Bowel sounds are normal. There is no distension.     Palpations: Abdomen is soft.     Tenderness: There is no abdominal tenderness.  Musculoskeletal:        General: No swelling. Normal range of motion.  Skin:    General: Skin is warm and dry.  Neurological:     Mental Status: He is alert and oriented to person, place, and time. Mental status is at baseline.  Psychiatric:        Mood and Affect: Mood normal.        Behavior: Behavior normal.      Assessment & Plan:   See Encounters Tab for problem based charting.  Patient discussed with Dr. Mikey Bussing

## 2023-01-01 NOTE — Patient Instructions (Signed)
Gregory Hernandez,  It was a pleasure seeing you in the clinic today.   I have prescribed voltaren gel to help with your knee arthritis. You can use this up to 3-4 times a day for knee pain. We are checking some labs today to make sure everything looks fine. I will call you once I have the results. Please come back to see Dr. Oswaldo Done in 2-3 months.  Please call our clinic at [phone] if you have any questions or concerns. The best time to call is Monday-Friday from 9am-4pm, but there is someone available 24/7 at the same number. If you need medication refills, please notify your pharmacy one week in advance and they will send [place] a request.   Thank you for letting us take part in your care. We look forward to seeing you next time!

## 2023-01-01 NOTE — Assessment & Plan Note (Signed)
BP well controlled at 124/69, on chlorthalidone 25mg  daily. Will check CMP today to ensure stable kidney function and electrolytes, last checked over a year ago.  -continue chlorthalidone -f/u CMP

## 2023-01-05 NOTE — Progress Notes (Signed)
Patient called.  Patient aware. Still with subclinical hyperthyroidism, no indication for treatment given asymptomatic. CMP unremarkable. LDL at goal. Still awaiting IFE, PE, and FLC to monitor MGUS.

## 2023-01-06 NOTE — Progress Notes (Signed)
Internal Medicine Clinic Attending  Case discussed with the resident at the time of the visit.  We reviewed the resident's history and exam and pertinent patient test results.  I agree with the assessment, diagnosis, and plan of care documented in the resident's note.  

## 2023-01-08 LAB — LIPID PANEL
Chol/HDL Ratio: 2.2 ratio (ref 0.0–5.0)
Cholesterol, Total: 177 mg/dL (ref 100–199)
HDL: 79 mg/dL (ref >39)
LDL Chol Calc (NIH): 81 mg/dL (ref 0–99)
Triglycerides: 95 mg/dL (ref 0–149)
VLDL Cholesterol Cal: 17 mg/dL (ref 5–40)

## 2023-01-08 LAB — CMP14 + ANION GAP
ALT: 19 IU/L (ref 0–44)
AST: 26 IU/L (ref 0–40)
Albumin/Globulin Ratio: 1.5 (ref 1.2–2.2)
Albumin: 4.1 g/dL (ref 3.9–4.9)
Alkaline Phosphatase: 88 IU/L (ref 44–121)
Anion Gap: 16 mmol/L (ref 10.0–18.0)
BUN/Creatinine Ratio: 11 (ref 10–24)
BUN: 7 mg/dL — ABNORMAL LOW (ref 8–27)
Bilirubin Total: <0.2 mg/dL (ref 0.0–1.2)
CO2: 23 mmol/L (ref 20–29)
Calcium: 9.9 mg/dL (ref 8.6–10.2)
Chloride: 100 mmol/L (ref 96–106)
Creatinine, Ser: 0.61 mg/dL — ABNORMAL LOW (ref 0.76–1.27)
Globulin, Total: 2.8 g/dL (ref 1.5–4.5)
Glucose: 93 mg/dL (ref 70–99)
Potassium: 4.2 mmol/L (ref 3.5–5.2)
Sodium: 139 mmol/L (ref 134–144)
eGFR: 108 mL/min/{1.73_m2} (ref >59)

## 2023-01-08 LAB — IFE, PE AND FLC, SERUM
Albumin SerPl Elph-Mcnc: 3.5 g/dL (ref 2.9–4.4)
Albumin/Glob SerPl: 1.1 (ref 0.7–1.7)
Alpha 1: 0.2 g/dL (ref 0.0–0.4)
Alpha2 Glob SerPl Elph-Mcnc: 0.6 g/dL (ref 0.4–1.0)
B-Globulin SerPl Elph-Mcnc: 1.2 g/dL (ref 0.7–1.3)
Gamma Glob SerPl Elph-Mcnc: 1.4 g/dL (ref 0.4–1.8)
Globulin, Total: 3.4 g/dL (ref 2.2–3.9)
Ig Kappa Free Light Chain: 26.8 mg/L — ABNORMAL HIGH (ref 3.3–19.4)
Ig Lambda Free Light Chain: 11.2 mg/L (ref 5.7–26.3)
IgA/Immunoglobulin A, Serum: 334 mg/dL (ref 61–437)
IgG (Immunoglobin G), Serum: 1700 mg/dL — ABNORMAL HIGH (ref 603–1613)
IgM (Immunoglobulin M), Srm: 69 mg/dL (ref 20–172)
KAPPA/LAMBDA RATIO: 2.39 — ABNORMAL HIGH (ref 0.26–1.65)
M Protein SerPl Elph-Mcnc: 0.7 g/dL — ABNORMAL HIGH
Total Protein: 6.9 g/dL (ref 6.0–8.5)

## 2023-01-08 LAB — TSH: TSH: 0.412 u[IU]/mL — ABNORMAL LOW (ref 0.450–4.500)

## 2023-01-08 LAB — T4, FREE: Free T4: 1.09 ng/dL (ref 0.82–1.77)

## 2023-01-13 NOTE — Progress Notes (Signed)
Patient called.  Patient aware. Serum IFE, PE, and FLC stable.

## 2023-02-12 ENCOUNTER — Ambulatory Visit (INDEPENDENT_AMBULATORY_CARE_PROVIDER_SITE_OTHER): Payer: Medicare HMO | Admitting: Student

## 2023-02-12 VITALS — BP 125/69 | HR 73 | Ht 63.0 in | Wt 96.5 lb

## 2023-02-12 DIAGNOSIS — E7800 Pure hypercholesterolemia, unspecified: Secondary | ICD-10-CM

## 2023-02-12 DIAGNOSIS — D472 Monoclonal gammopathy: Secondary | ICD-10-CM | POA: Diagnosis not present

## 2023-02-12 DIAGNOSIS — I10 Essential (primary) hypertension: Secondary | ICD-10-CM | POA: Diagnosis not present

## 2023-02-12 DIAGNOSIS — E785 Hyperlipidemia, unspecified: Secondary | ICD-10-CM | POA: Diagnosis not present

## 2023-02-12 DIAGNOSIS — E041 Nontoxic single thyroid nodule: Secondary | ICD-10-CM | POA: Diagnosis not present

## 2023-02-12 NOTE — Assessment & Plan Note (Signed)
Patient with thyromegaly with bilateral nodules noted on previous thyroid ultrasounds. R nodule is 1cm in size and left nodule is 4.4cm in size per thyroid ultrasound in 2022. He has had prior FNA biopsy on left sided nodule consistent with TI-RADS 3 risk. Annual surveillance was recommended and he is overdue. Ordered thyroid ultrasound to reevaluate nodules.  Plan: -f/u thyroid ultrasound -Annual thyroid ultrasound surveillance

## 2023-02-12 NOTE — Assessment & Plan Note (Signed)
BP 125/69 on chlorthalidone 25mg  daily. Well controlled and stable.

## 2023-02-12 NOTE — Assessment & Plan Note (Signed)
His annual serum IFE with light chains were stable on recent check at last visit. Calcium normalized as well. Denies any bone pains, weakness, neurological symptoms. This appears to be clinically stable at this time.  -continue annual surveillance

## 2023-02-12 NOTE — Patient Instructions (Addendum)
Gregory Hernandez,  It was a pleasure seeing you in the clinic today.   You are doing very well so keep up the great work!  As I mentioned at our last visit, your labs have remained stable. Please remember to get your shingles vaccine. You can call your local pharmacy and schedule an appointment to get your vaccine.  I have ordered a thyroid ultrasound to take a look at your thyroid gland. They will call you to get this scheduled. Please come back for your next visit in 6 months.  Please call our clinic at [phone] if you have any questions or concerns. The best time to call is Monday-Friday from 9am-4pm, but there is someone available 24/7 at the same number. If you need medication refills, please notify your pharmacy one week in advance and they will send [place] a request.   Thank you for letting us take part in your care. We look forward to seeing you next time!

## 2023-02-12 NOTE — Assessment & Plan Note (Signed)
LDL 81 on recent check, at goal. Continue pravastatin 20mg  daily.

## 2023-02-12 NOTE — Progress Notes (Signed)
   CC: f/u HTN, thyroid nodule  HPI:  [name] is a 62 y.o. male with history listed below presenting to the [facility] for f/u HTN, thyroid nodule. Please see individualized problem based charting for full HPI.  Past Medical History:  Diagnosis Date   Adenomatous colon polyp 12/14/2011   Dermatitis    H/O Tinea dermatitis   Elevated BP    Elevated transaminase level    Mild elevation.   Facial pain 2007   Left eye and left facial pain January 2007; evaluated by ophthalmologist Dr. Cecilie Kicks; CT of face and orbits 09/16/2005 unremarkable; etiology unclear; improved by 10/28/2005.   Fracture of left shoulder 2012   Helicobacter pylori gastritis 11/16/2014   EGD done 11/01/2014 by Dr. 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  Dr. Marge Duncans notes indicate plan to start treatment with Prevpac.    Rhinitis    Seasonal   TB (tuberculosis) 1996   Pulmonary TB (right upper lobe infiltrate) diagnosed May 1996; treated at [facility]   Tobacco abuse     Review of Systems:  Negative aside from that listed in individualized problem based charting.  Physical Exam:  Vitals:   02/12/23 1000  BP: 125/69  Pulse: 73  SpO2: 100%   Physical Exam Constitutional:      Appearance: Normal appearance. He is not ill-appearing.  HENT:     Mouth/Throat:     Mouth: Mucous membranes are moist.     Pharynx: Oropharynx is clear. No oropharyngeal exudate.  Eyes:     General: No scleral icterus.    Extraocular Movements: Extraocular movements intact.     Conjunctiva/sclera: Conjunctivae normal.     Pupils: Pupils are equal, round, and reactive to light.  Neck:     Comments: Palpable left thyroid nodule, nontender. Cardiovascular:     Rate and Rhythm: Normal rate and regular rhythm.     Heart sounds: Normal heart sounds. No murmur heard.    No friction rub. No gallop.  Pulmonary:     Effort:  Pulmonary effort is normal.     Breath sounds: Normal breath sounds. No wheezing, rhonchi or rales.  Abdominal:     General: Bowel sounds are normal. There is no distension.     Palpations: Abdomen is soft.     Tenderness: There is no abdominal tenderness.  Skin:    General: Skin is warm and dry.  Neurological:     General: No focal deficit present.     Mental Status: He is alert and oriented to person, place, and time.  Psychiatric:        Mood and Affect: Mood normal.        Behavior: Behavior normal.      Assessment & Plan:   See Encounters Tab for problem based charting.  Patient discussed with Dr. Oswaldo Done

## 2023-02-15 NOTE — Progress Notes (Signed)
Internal Medicine Clinic Attending  I saw and evaluated the patient.  I personally confirmed the key portions of the history and exam documented by Dr. Jinwala and I reviewed pertinent patient test results.  The assessment, diagnosis, and plan were formulated together and I agree with the documentation in the resident's note.  

## 2023-03-12 ENCOUNTER — Telehealth: Payer: Self-pay | Admitting: *Deleted

## 2023-03-12 NOTE — Telephone Encounter (Signed)
Patient was contacted regarding his [place] thyriod appointment at [facility] at 03-17-2023  @ 3:00 pm. Phone number given to his mother to call if needing to reschedule [phone].

## 2023-03-17 ENCOUNTER — Ambulatory Visit (HOSPITAL_COMMUNITY): Payer: Medicare HMO

## 2023-03-18 ENCOUNTER — Ambulatory Visit (HOSPITAL_COMMUNITY)
Admission: RE | Admit: 2023-03-18 | Discharge: 2023-03-18 | Disposition: A | Discharge status: Home / Self Care | Payer: Medicare HMO | Source: Ambulatory Visit | Attending: Student in an Organized Health Care Education/Training Program | Admitting: Student in an Organized Health Care Education/Training Program

## 2023-03-18 DIAGNOSIS — E042 Nontoxic multinodular goiter: Secondary | ICD-10-CM | POA: Diagnosis not present

## 2023-03-18 DIAGNOSIS — E041 Nontoxic single thyroid nodule: Secondary | ICD-10-CM | POA: Diagnosis not present

## 2023-06-16 ENCOUNTER — Encounter: Payer: Self-pay | Admitting: Student

## 2023-06-16 ENCOUNTER — Ambulatory Visit: Payer: Medicare HMO | Admitting: Student

## 2023-06-16 VITALS — BP 116/72 | HR 96 | Temp 98.2°F | Ht 63.0 in | Wt 91.4 lb

## 2023-06-16 DIAGNOSIS — M25562 Pain in left knee: Secondary | ICD-10-CM

## 2023-06-16 DIAGNOSIS — M1711 Unilateral primary osteoarthritis, right knee: Secondary | ICD-10-CM

## 2023-06-16 DIAGNOSIS — Z87891 Personal history of nicotine dependence: Secondary | ICD-10-CM | POA: Diagnosis not present

## 2023-06-16 DIAGNOSIS — I10 Essential (primary) hypertension: Secondary | ICD-10-CM | POA: Diagnosis not present

## 2023-06-16 DIAGNOSIS — M25561 Pain in right knee: Secondary | ICD-10-CM | POA: Diagnosis not present

## 2023-06-16 DIAGNOSIS — E7800 Pure hypercholesterolemia, unspecified: Secondary | ICD-10-CM | POA: Diagnosis not present

## 2023-06-16 MED ORDER — DICLOFENAC SODIUM 1 % EX GEL
2.0000 g | Freq: Four times a day (QID) | CUTANEOUS | 0 refills | Status: AC
Start: 2023-06-16 — End: ?

## 2023-06-16 NOTE — Progress Notes (Signed)
Established Patient Office Visit  Subjective   Patient ID: Gregory Hernandez, male    DOB: [birth year]  Age: 62 y.o. MRN: [id number]  Chief Complaint  Patient presents with   Knee Pain    Bilateral pain    HPI  This is a 30 Male living with a history stated below and presents today for bilateral knee pain . Please see problem based assessment and plan for additional details.   Hx of Gregory Hernandez.   Patient Active Problem List   Diagnosis Date Noted   Acute bilateral knee pain 06/16/2023   Pain of finger of left hand 12/25/2021   Skin abnormality 05/19/2021   Osteoarthritis of right knee 07/17/2020   Thyroid nodule 06/13/2018   Osteoarthritis of left knee 11/23/2016   Gregory Hernandez (monoclonal gammopathy of unknown significance) 03/09/2016   Subclinical hyperthyroidism 10/04/2015   Hyperlipidemia 06/17/2015   Alcohol Use Disorder 06/17/2015   Loss of weight 05/23/2014   Preventative health care 08/19/2011   Hypertension 09/21/2008   Past Medical History:  Diagnosis Date   Adenomatous colon polyp 12/14/2011   Dermatitis    H/O Tinea dermatitis   Elevated BP    Elevated transaminase level    Mild elevation.   Facial pain 2007   Left eye and left facial pain January 2007; evaluated by ophthalmologist Dr. Cecilie Kicks; CT of face and orbits 09/16/2005 unremarkable; etiology unclear; improved by 10/28/2005.   Fracture of left shoulder 2012   Helicobacter pylori gastritis 11/16/2014   EGD done 11/01/2014 by Dr. 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  Dr. Marge Duncans notes indicate plan to start treatment with Prevpac.    Rhinitis    Seasonal   TB (tuberculosis) 1996   Pulmonary TB (right upper lobe infiltrate) diagnosed May 1996; treated at [facility]   Tobacco abuse    Past Surgical History:  Procedure Laterality Date   NO PAST SURGERIES      Family Status  Relation Name Status   Mother   Alive   Cousin  (Not Specified)   Father  Deceased  No partnership data on file   Family History  Problem Relation Age of Onset   Hypertension Mother    Diabetes Mother    Heart attack Mother    Peptic Ulcer Mother    Arthritis Mother    Cancer Cousin    No Known Allergies  Review of Systems  Constitutional:  Negative for chills and fever.  HENT:  Negative for congestion and sore throat.   Respiratory:  Negative for cough and shortness of breath.   Cardiovascular:  Negative for chest pain and palpitations.  Gastrointestinal:  Negative for abdominal pain, nausea and vomiting.  Musculoskeletal:  Positive for joint pain.      Objective:     BP 116/72 (BP Location: Right Arm, Patient Position: Sitting, Cuff Size: Normal)   Pulse 96   Temp 98.2 F (36.8 C) (Oral)   Ht 5\' 3"  (1.6 m)   Wt 91 lb 6.4 oz (41.5 kg)   SpO2 100%   BMI 16.19 kg/m  BP Readings from Last 3 Encounters:  06/16/23 116/72  02/12/23 125/69  01/01/23 124/69   Wt Readings from Last 3 Encounters:  06/16/23 91 lb 6.4 oz (41.5 kg)  02/12/23 96 lb 8 oz (43.8 kg)  01/01/23 96 lb 6.4 oz (43.7 kg)    Physical Exam  Constitutional:  sitting in Chair, in no acute  distress HENT: normocephalic atraumatic, mucous membranes moist Cardiovascular: regular rate and rhythm, no m/r/g Pulmonary/Chest: normal work of breathing on room air, lungs clear to auscultation bilaterally Abdominal: soft, non-tender, non-distended MSK: Positive for Crepitus L>R, positive for mild knee deformity, ROM intact. Negative for hot, red, or swollen knees. Mild TTP Left lateral patella region.  Skin: warm and dry Psych: normal mood and behavior  No results found for any visits on 06/16/23.  Last CBC Lab Results  Component Value Date   WBC 4.1 12/24/2019   HGB 15.0 12/24/2019   HCT 46.3 12/24/2019   MCV 88.0 12/24/2019   MCH 28.5 12/24/2019   RDW 14.6 12/24/2019   PLT 217 12/24/2019   Last metabolic panel Lab Results   Component Value Date   GLUCOSE 93 01/01/2023   NA 139 01/01/2023   K 4.2 01/01/2023   CL 100 01/01/2023   CO2 23 01/01/2023   BUN 7 (L) 01/01/2023   CREATININE 0.61 (L) 01/01/2023   EGFR 108 01/01/2023   CALCIUM 9.9 01/01/2023   PROT 6.9 01/01/2023   ALBUMIN 4.1 01/01/2023   LABGLOB 3.4 01/01/2023   LABGLOB 2.8 01/01/2023   AGRATIO 1.5 01/01/2023   BILITOT <0.2 01/01/2023   ALKPHOS 88 01/01/2023   AST 26 01/01/2023   ALT 19 01/01/2023   ANIONGAP 11 12/24/2019   Last lipids Lab Results  Component Value Date   CHOL 177 01/01/2023   HDL 79 01/01/2023   LDLCALC 81 01/01/2023   TRIG 95 01/01/2023   CHOLHDL 2.2 01/01/2023   Last hemoglobin A1c No results found for: "HGBA1C" Last thyroid functions Lab Results  Component Value Date   TSH 0.412 (L) 01/01/2023   Last vitamin D Lab Results  Component Value Date   VD25OH 26.1 (L) 06/26/2019      The 10-year ASCVD risk score (Arnett DK, et al., 2019) is: 10.7%    Assessment & Plan:   Problem List Items Addressed This Visit       Cardiovascular and Mediastinum   Hypertension (Chronic)    BP Readings from Last 3 Encounters:  06/16/23 116/72  02/12/23 125/69  01/01/23 124/69   Patient is taking chlorthalidone 25 mg daily.  Blood pressure is controlled.  No other concerns at this time.        Musculoskeletal and Integument   Osteoarthritis of right knee   Relevant Medications   diclofenac Sodium (VOLTAREN) 1 % GEL     Other   Hyperlipidemia (Chronic)    Lab Results  Component Value Date   CHOL 177 01/01/2023   HDL 79 01/01/2023   LDLCALC 81 01/01/2023   TRIG 95 01/01/2023   CHOLHDL 2.2 01/01/2023   Patient is currently taking pravastatin 20 mg daily.  No other concerns at this time.      Acute bilateral knee pain - Primary    This is a 62 year old male with past medical history of Gregory Hernandez presents today for bilateral knee pain.  Patient reports started last week, reports the pain comes and goes.   Patient reports pain is mostly localized to his anterior patellar region.  Patient reports that sometimes it his left knee sometimes is his right knee, reports some mild swelling.  He reports he applies an ointment that has been held helping.  Denies any fall or trauma to the knees.  Denies any recent fever or chills.  Denies any history of gout.  Patient reports that he does a lot of physical activity, he does a lot of  walking.  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  Per the physical exam, crepitus is present left greater than the right knee, range of motion is intact.  There is mild tenderness to palpation at the lateral patella, however it is not hot, swollen, acutely tender.  Unlikely infectious arthritis or septic arthritis.  Patient had imaging of the left and the right knee in 2018 that showed mild degenerative changes.  I do not think we need to do imaging at this time.  Patient was advised to use over-the-counter Voltaren gel and Tylenol as needed for pain control. -Voltaren gel as needed for pain control -Over-the-counter Tylenol as needed for pain control        Return in about 1 year (around 06/15/2024) for 6-8 months as needed .    Jeral Pinch, DO

## 2023-06-16 NOTE — Assessment & Plan Note (Signed)
Lab Results  Component Value Date   CHOL 177 01/01/2023   HDL 79 01/01/2023   LDLCALC 81 01/01/2023   TRIG 95 01/01/2023   CHOLHDL 2.2 01/01/2023   Patient is currently taking pravastatin 20 mg daily.  No other concerns at this time.

## 2023-06-16 NOTE — Assessment & Plan Note (Signed)
This is a 62 year old male with past medical history of MGUS presents today for bilateral knee pain.  Patient reports started last week, reports the pain comes and goes.  Patient reports pain is mostly localized to his anterior patellar region.  Patient reports that sometimes it his left knee sometimes is his right knee, reports some mild swelling.  He reports he applies an ointment that has been held helping.  Denies any fall or trauma to the knees.  Denies any recent fever or chills.  Denies any history of gout.  Patient reports that he does a lot of physical activity, he does a lot of walking.  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  Per the physical exam, crepitus is present left greater than the right knee, range of motion is intact.  There is mild tenderness to palpation at the lateral patella, however it is not hot, swollen, acutely tender.  Unlikely infectious arthritis or septic arthritis.  Patient had imaging of the left and the right knee in 2018 that showed mild degenerative changes.  I do not think we need to do imaging at this time.  Patient was advised to use over-the-counter Voltaren gel and Tylenol as needed for pain control. -Voltaren gel as needed for pain control -Over-the-counter Tylenol as needed for pain control

## 2023-06-16 NOTE — Patient Instructions (Signed)
Thank you, Mr.Gabriela Giannelli Deakins for allowing [place] to provide your care today. Today we discussed   Knee pain - Voltaren gel apply at the area of pain - Tylenol over the counter for pain as needed.    Activity Rest your knee. Do not do things that cause pain or make pain worse. Avoid activities where both feet leave the ground at the same time (high-impact activities). Examples are running, jumping rope, and doing jumping jacks. Work with a physical therapist to make a safe exercise program, as told by your doctor. Managing pain, stiffness, and swelling  If told, put ice on the knee. To do this: If you have a removable knee sleeve or brace, take it off as told by your doctor. Put ice in a plastic bag. Place a towel between your skin and the bag. Leave the ice on for 20 minutes, 2-3 times a day. Take off the ice if your skin turns bright red. This is very important. If you cannot feel pain, heat, or cold, you have a greater risk of damage to the area. If told, use an elastic bandage to put pressure (compression) on your injured knee. Raise your knee above the level of your heart while you are sitting or lying down. Sleep with a pillow under your knee..    I have ordered the following labs for you:  Lab Orders  No laboratory test(s) ordered today     Tests ordered today:  None   Referrals ordered today:   Referral Orders  No referral(s) requested today     I have ordered the following medication/changed the following medications:   Stop the following medications: Medications Discontinued During This Encounter  Medication Reason   diclofenac Sodium (VOLTAREN) 1 % GEL Reorder     Start the following medications: Meds ordered this encounter  Medications   diclofenac Sodium (VOLTAREN) 1 % GEL    Sig: Apply 2 g topically 4 (four) times daily.    Dispense:  100 g    Refill:  0     Follow up:  As needed     Remember:   Should you have any questions or concerns please  call the internal medicine clinic at [phone].     Jeral Pinch, DO [facility]

## 2023-06-16 NOTE — Assessment & Plan Note (Signed)
BP Readings from Last 3 Encounters:  06/16/23 116/72  02/12/23 125/69  01/01/23 124/69   Patient is taking chlorthalidone 25 mg daily.  Blood pressure is controlled.  No other concerns at this time.

## 2023-06-29 NOTE — Progress Notes (Signed)
 Internal Medicine Clinic Attending  I was physically present during the key portions of the resident provided service and participated in the medical decision making of patient's management care. I reviewed pertinent patient test results.  The assessment, diagnosis, and plan were formulated together and I agree with the documentation in the resident's note.  Inez Catalina, MD

## 2023-06-29 NOTE — Addendum Note (Signed)
Addended by: Debe Coder B on: 06/29/2023 05:39 AM   Modules accepted: Level of Service

## 2023-09-07 ENCOUNTER — Encounter: Payer: Self-pay | Admitting: *Deleted

## 2023-10-18 ENCOUNTER — Encounter: Payer: Self-pay | Admitting: Student in an Organized Health Care Education/Training Program

## 2023-10-18 ENCOUNTER — Ambulatory Visit (INDEPENDENT_AMBULATORY_CARE_PROVIDER_SITE_OTHER): Payer: 59 | Admitting: Student in an Organized Health Care Education/Training Program

## 2023-10-18 VITALS — BP 138/84 | HR 110 | Temp 97.4°F | Ht 63.0 in | Wt 90.7 lb

## 2023-10-18 DIAGNOSIS — D509 Iron deficiency anemia, unspecified: Secondary | ICD-10-CM

## 2023-10-18 DIAGNOSIS — E7800 Pure hypercholesterolemia, unspecified: Secondary | ICD-10-CM | POA: Diagnosis not present

## 2023-10-18 DIAGNOSIS — E0590 Thyrotoxicosis, unspecified without thyrotoxic crisis or storm: Secondary | ICD-10-CM | POA: Diagnosis not present

## 2023-10-18 DIAGNOSIS — I10 Essential (primary) hypertension: Secondary | ICD-10-CM | POA: Diagnosis not present

## 2023-10-18 DIAGNOSIS — D472 Monoclonal gammopathy: Secondary | ICD-10-CM | POA: Diagnosis not present

## 2023-10-18 DIAGNOSIS — E785 Hyperlipidemia, unspecified: Secondary | ICD-10-CM

## 2023-10-18 DIAGNOSIS — D508 Other iron deficiency anemias: Secondary | ICD-10-CM

## 2023-10-18 DIAGNOSIS — R2232 Localized swelling, mass and lump, left upper limb: Secondary | ICD-10-CM | POA: Diagnosis not present

## 2023-10-18 NOTE — Assessment & Plan Note (Addendum)
 Serum IFE with light chains and calcium stable on 01/01/23. Repeat annual surveillance today.

## 2023-10-18 NOTE — Assessment & Plan Note (Addendum)
 History of multinodular goiter and benign follicular nodule. Currently asymptomatic. Repeat thyroid  studies today with TSH and T4.

## 2023-10-18 NOTE — Progress Notes (Signed)
 Attestation for Student Documentation:  I personally was present and re-performed the history, physical exam and medical decision-making activities of this service and have verified that the service and findings are accurately documented in the student's note.  63 year old person here for evaluation of hypertension.  Doing very well since I last saw him.  Continues to live independently.  Support from his family.  No acute concerns today.  Reports good adherence with medications.  No alcohol use.  Still having some occasional falls, has an abrasion on his left forehead, but no ED visits.  On exam we incidentally noted a 3 cm soft mass on the his lateral deltoid.  Because it becomes firm with abduction it feels like it is a part of the deltoid muscle.  Given his history of falls and traumas I suspect may be a partial tear of the deltoid.  He is otherwise asymptomatic and so it seems low risk for a malignant growth like a sarcoma.  I pointed this mass out to him, and advised him to let us  know if it gets any larger or if he starts having discomfort from the pain.  Other chronic issues were managing today is checking immunofixation and light chains to monitor MGUS.  He has a multinodular goiter, largest nodules on the left side, will be due for annual ultrasound monitoring in July.  Ether Hercules, MD 10/18/2023, 11:38 AM

## 2023-10-18 NOTE — Progress Notes (Addendum)
 Subjective:   Patient ID: Gregory Hernandez male   DOB: [birth year] 63 y.o.   MRN: [id number]  HPI: [name] is a 63 y.o. person presenting for hypertension management and routine labs. He does not have any acute concerns today and reports that he is doing well. He walks to the store and to his mother's house and his knee pain has improved with Voltaren  gel. He reports that he only consumes alcohol on his birthday. He is trying a pill from a TV ad for weight gain and was encouraged to bring this in next visit. Monitoring today includes MGUS and subclinical hyperthyroidism.  Past Medical History:  Diagnosis Date   Adenomatous colon polyp 12/14/2011   Dermatitis    H/O Tinea dermatitis   Elevated BP    Elevated transaminase level    Mild elevation.   Facial pain 2007   Left eye and left facial pain January 2007; evaluated by ophthalmologist Dr. Radames Buff; CT of face and orbits 09/16/2005 unremarkable; etiology unclear; improved by 10/28/2005.   Fracture of left shoulder 2012   Helicobacter pylori gastritis 11/16/2014   EGD done 11/01/2014 by Dr. Honey Lusty to work up weight loss showed gastritis; biopsy showed chronic active gastritis with Helicobacter pylori organisms.  Dr. Victoriano Grate notes indicate plan to start treatment with Prevpac.    Rhinitis    Seasonal   TB (tuberculosis) 1996   Pulmonary TB (right upper lobe infiltrate) diagnosed May 1996; treated at [facility]   Tobacco abuse    Current Outpatient Medications  Medication Sig Dispense Refill   chlorthalidone  (HYGROTON ) 25 MG tablet TAKE 1 TABLET EVERY DAY 90 tablet 3   diclofenac  Sodium (VOLTAREN ) 1 % GEL Apply 2 g topically 4 (four) times daily. 100 g 0   Nutritional Supplements (ENSURE HIGH PROTEIN) POWD Take 771 g by mouth daily. 771 g 1   pravastatin  (PRAVACHOL ) 20 MG tablet TAKE 1 TABLET BY MOUTH EVERY DAY IN THE EVENING 90 tablet 3   No current facility-administered medications for this visit.    Family History  Problem Relation Age of Onset   Hypertension Mother    Diabetes Mother    Heart attack Mother    Peptic Ulcer Mother    Arthritis Mother    Cancer Cousin    Social History   Socioeconomic History   Marital status: Single    Spouse name: Not on file   Number of children: Not on file   Years of education: Not on file   Highest education level: Not on file  Occupational History   Not on file  Tobacco Use   Smoking status: Former    Current packs/day: 0.00    Average packs/day: 2.0 packs/day for 7.0 years (14.0 ttl pk-yrs)    Types: Cigarettes    Start date: 08/18/2002    Quit date: 08/18/2009    Years since quitting: 14.1   Smokeless tobacco: Never  Substance and Sexual Activity   Alcohol use: No    Alcohol/week: 0.0 standard drinks of alcohol    Comment: About 2-3 beers per day. 2 40 ounces a day   Drug use: No   Sexual activity: Not Currently  Other Topics Concern   Not on file  Social History Narrative   Not on file   Social Drivers of Health   Financial Resource Strain: Not on file  Food Insecurity: Not on file  Transportation Needs: Not on file  Physical Activity: Not on file  Stress:  Not on file  Social Connections: Not on file   Review of Systems: Pertinent items noted in HPI and remainder of comprehensive ROS otherwise negative.  Objective:  Physical Exam: Vitals:   10/18/23 0958  BP: 138/84  Pulse: (!) 110  Temp: (!) 97.4 F (36.3 C)  TempSrc: Oral  Weight: 90 lb 11.2 oz (41.1 kg)  Height: 5\' 3"  (1.6 m)   BP 138/84 (BP Location: Left Arm, Patient Position: Sitting, Cuff Size: Small)   Pulse (!) 110   Temp (!) 97.4 F (36.3 C) (Oral)   Ht 5\' 3"  (1.6 m)   Wt 90 lb 11.2 oz (41.1 kg)   BMI 16.07 kg/m   General: Well appearing and in no acute distress Neuro: A&O x4, normal affect HEENT: Normocephalic. Moist mucous membranes. Goiter with palpable left sided nodule Cardiovascular: RRR, no m/r/g. Normal S1 and S2 without S3  or S4. No peripheral edema Pulmonary: CTAB, no wheezes, rhonchi or rales. Normal work of breathing on room air  Skin: Warm and dry. MSK: Normal ROM of all extremities. 3x3cm mass noted on the inferior aspect of the left lateral deltoid.  Neuro: Alert, conversational. Dysarthric. Grossly normal strength and gait. Normal get up and go.  Psych: Mood and affect appropriate.   Assessment & Plan:  Hypertension BP 138/84 today, which is elevated compared to previously. Gregory Hernandez reports that he has not taken his chlorthalidone  25 mg yet today. No medication changes. CBC and CMP today.  Subclinical hyperthyroidism History of multinodular goiter and benign follicular nodule. Currently asymptomatic. Repeat thyroid  studies today.   MGUS (monoclonal gammopathy of unknown significance) Serum IFE with light chains and calcium stable on 01/01/23. Repeat annual surveillance today.  Hyperlipidemia On pravastatin  20 mg daily. Lipid panel on 01/01/2023 wnl. Repeat lipid panel today.  Arm mass, left Mass noted on inferior aspect of the lateral deltoid on exam today, sized at 3x3cm. Mass is soft when arm is relaxed and becomes firm when arm is abducted. Bedside ultrasound showed striations within the mass that suggests a muscular origin. Gregory Hernandez reports that he sleeps on this arm with no pain and is not experiencing weakness. No tenderness to palpation. Continue to monitor.   Christa Course, MS3

## 2023-10-18 NOTE — Assessment & Plan Note (Signed)
 On pravastatin  20 mg daily. Lipid panel on 01/01/2023 wnl. Repeat lipid panel today.

## 2023-10-18 NOTE — Assessment & Plan Note (Addendum)
 BP 138/84 today, which is elevated compared to previously. Gregory Hernandez reports that he has not taken his chlorthalidone  25 mg yet today. No medication changes. CBC and CMP today.

## 2023-10-18 NOTE — Assessment & Plan Note (Addendum)
 Mass noted on inferior aspect of the lateral deltoid on exam today, sized at 3x3cm. Mass is soft when arm is relaxed and becomes firm when arm is abducted. Bedside ultrasound showed striations within the mass that suggests a muscular origin. Gregory Hernandez reports that he sleeps on this arm with no pain and is not experiencing weakness. No tenderness to palpation. Continue to monitor.

## 2023-10-18 NOTE — Patient Instructions (Addendum)
 Thank you, [name] for allowing us  to provide your care today.   We will call you with the results of today's blood work.   I have ordered the following labs for you:   Lab Orders         CMP14 + Anion Gap         Lipid Profile         CBC no Diff         TSH         T4, Free         IFE, PE and FLC, Serum       Follow up: 6 months

## 2023-10-19 ENCOUNTER — Other Ambulatory Visit: Payer: Self-pay

## 2023-10-19 MED ORDER — PRAVASTATIN SODIUM 20 MG PO TABS: ORAL_TABLET | ORAL | 3 refills | Status: AC

## 2023-10-19 NOTE — Addendum Note (Signed)
Addended by: Erlinda Hong T on: 10/19/2023 02:34 PM   Modules accepted: Orders

## 2023-10-20 ENCOUNTER — Ambulatory Visit (INDEPENDENT_AMBULATORY_CARE_PROVIDER_SITE_OTHER): Payer: 59

## 2023-10-20 VITALS — Wt 97.0 lb

## 2023-10-20 DIAGNOSIS — Z0000 Encounter for general adult medical examination without abnormal findings: Secondary | ICD-10-CM

## 2023-10-20 NOTE — Progress Notes (Signed)
Subjective:   Gregory Hernandez is a 63 y.o. male who presents for an Initial Medicare Annual Wellness Visit.  Visit Complete: Virtual I connected with  Gregory Hernandez on 10/20/23 by a audio enabled telemedicine application and verified that I am speaking with the correct person using two identifiers.  Patient Location: Home  Provider Location: Office/Clinic  I discussed the limitations of evaluation and management by telemedicine. The patient expressed understanding and agreed to proceed.  Vital Signs: Because this visit was a virtual/telehealth visit, some criteria may be missing or patient reported. Any vitals not documented were not able to be obtained and vitals that have been documented are patient reported.  This patient declined Interactive audio and Acupuncturist. Therefore the visit was completed with audio only.  Cardiac Risk Factors include: advanced age (>57men, >30 women);dyslipidemia;hypertension;male gender;family history of premature cardiovascular disease     Objective:    Today's Vitals   10/20/23 1122  Weight: 97 lb (44 kg)  PainSc: 0-No pain   Body mass index is 17.18 kg/m.     10/20/2023   11:24 AM 06/16/2023    1:18 PM 02/12/2023   10:29 AM 12/24/2021    9:03 AM 05/19/2021    3:41 PM 02/10/2021   12:07 PM 07/17/2020    1:19 PM  Advanced Directives  Does Patient Have a Medical Advance Directive? Yes No No No No No No  Type of Estate agent of [place];Living will        Copy of Healthcare Power of Attorney in Chart? No - copy requested        Would patient like information on creating a medical advance directive?  No - Patient declined No - Patient declined No - Patient declined No - Patient declined No - Patient declined No - Patient declined    Current Medications (verified) Outpatient Encounter Medications as of 10/20/2023  Medication Sig   chlorthalidone (HYGROTON) 25 MG tablet TAKE 1 TABLET EVERY DAY   diclofenac Sodium  (VOLTAREN) 1 % GEL Apply 2 g topically 4 (four) times daily.   pravastatin (PRAVACHOL) 20 MG tablet TAKE 1 TABLET BY MOUTH EVERY DAY IN THE EVENING   Nutritional Supplements (ENSURE HIGH PROTEIN) POWD Take 771 g by mouth daily. (Patient not taking: Reported on 10/20/2023)   No facility-administered encounter medications on file as of 10/20/2023.    Allergies (verified) Patient has no known allergies.   History: Past Medical History:  Diagnosis Date   Adenomatous colon polyp 12/14/2011   Dermatitis    H/O Tinea dermatitis   Elevated BP    Elevated transaminase level    Mild elevation.   Facial pain 2007   Left eye and left facial pain January 2007; evaluated by ophthalmologist Dr. Cecilie Kicks; CT of face and orbits 09/16/2005 unremarkable; etiology unclear; improved by 10/28/2005.   Fracture of left shoulder 2012   Helicobacter pylori gastritis 11/16/2014   EGD done 11/01/2014 by Dr. 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  Dr. Marge Duncans notes indicate plan to start treatment with Prevpac.    Rhinitis    Seasonal   TB (tuberculosis) 1996   Pulmonary TB (right upper lobe infiltrate) diagnosed May 1996; treated at [facility]   Tobacco abuse    Past Surgical History:  Procedure Laterality Date   NO PAST SURGERIES     Family History  Problem Relation Age of Onset   Hypertension Mother  Diabetes Mother    Heart attack Mother    Peptic Ulcer Mother    Arthritis Mother    Cancer Cousin    Social History   Socioeconomic History   Marital status: Single    Spouse name: Not on file   Number of children: Not on file   Years of education: Not on file   Highest education level: High school graduate  Occupational History   Not on file  Tobacco Use   Smoking status: Former    Current packs/day: 0.00    Average packs/day: 2.0 packs/day for 7.0 years (14.0 ttl pk-yrs)    Types:  Cigarettes    Start date: 08/18/2002    Quit date: 08/18/2009    Years since quitting: 14.1   Smokeless tobacco: Never  Substance and Sexual Activity   Alcohol use: No    Alcohol/week: 0.0 standard drinks of alcohol    Comment: About 2-3 beers per day. 2 40 ounces a day   Drug use: No   Sexual activity: Not Currently  Other Topics Concern   Not on file  Social History Narrative   Not on file   Social Drivers of Health   Financial Resource Strain: Low Risk  (10/20/2023)   Overall Financial Resource Strain (CARDIA)    Difficulty of Paying Living Expenses: Not hard at all  Food Insecurity: No Food Insecurity (10/20/2023)   Hunger Vital Sign    Worried About Running Out of Food in the Last Year: Never true    Ran Out of Food in the Last Year: Never true  Transportation Needs: No Transportation Needs (10/20/2023)   PRAPARE - Administrator, Civil Service (Medical): No    Lack of Transportation (Non-Medical): No  Physical Activity: Sufficiently Active (10/20/2023)   Exercise Vital Sign    Days of Exercise per Week: 5 days    Minutes of Exercise per Session: 30 min  Stress: No Stress Concern Present (10/20/2023)   [employer] of Occupational Health - Occupational Stress Questionnaire    Feeling of Stress : Not at all  Social Connections: Moderately Integrated (10/20/2023)   Social Connection and Isolation Panel [NHANES]    Frequency of Communication with Friends and Family: More than three times a week    Frequency of Social Gatherings with Friends and Family: More than three times a week    Attends Religious Services: More than 4 times per year    Active Member of [employer] or Organizations: Yes    Attends Engineer, structural: More than 4 times per year    Marital Status: Never married    Tobacco Counseling Counseling given: Not Answered   Clinical Intake:  Pre-visit preparation completed: Yes  Pain : No/denies pain Pain Score: 0-No pain     BMI -  recorded: 17.18 Nutritional Status: BMI <19  Underweight Nutritional Risks: None Diabetes: No  How often do you need to have someone help you when you read instructions, pamphlets, or other written materials from your doctor or pharmacy?: 1 - Never What is the last grade level you completed in school?: HSG  Interpreter Needed?: No  Information entered by :: Terecia Plaut N. Gardy Montanari, LPN.   Activities of Daily Living    10/20/2023   11:28 AM 06/16/2023    1:18 PM  In your present state of health, do you have any difficulty performing the following activities:  Hearing? 0 0  Vision? 0 0  Difficulty concentrating or making decisions? 0 0  Walking or climbing stairs? 0 0  Dressing or bathing? 0 0  Doing errands, shopping? 0 0  Preparing Food and eating ? N   Using the Toilet? N   In the past six months, have you accidently leaked urine? N   Do you have problems with loss of bowel control? N   Managing your Medications? N   Managing your Finances? N   Housekeeping or managing your Housekeeping? N     Patient Care Team: Tyson Alias, MD as PCP - [facility] [employer], [place] as Consulting Physician (Optometry)  Indicate any recent Medical Services you may have received from other than [facility] providers in the past year (date may be approximate).     Assessment:   This is a routine wellness examination for [facility].  Hearing/Vision screen Hearing Screening - Comments:: Denies hearing difficulties.  Vision Screening - Comments:: Wears rx glasses - up to date with routine eye exams with Shearon Stalls, OD.    Goals Addressed             This Visit's Progress    Client understands the importance of follow-up with providers by attending scheduled visits        Depression Screen    10/20/2023   11:27 AM 06/16/2023    1:18 PM 02/12/2023   10:28 AM 12/24/2021    9:03 AM 05/19/2021    3:43 PM 02/10/2021   12:09 PM 07/17/2020    1:20 PM  PHQ 2/9 Scores  PHQ - 2 Score 0 0 0 0 0 0  0  PHQ- 9 Score 0   0 1 0 0    Fall Risk    10/20/2023   11:26 AM 06/16/2023    1:18 PM 02/12/2023   10:28 AM 12/24/2021    9:03 AM 05/19/2021    3:42 PM  Fall Risk   Falls in the past year? 0 0 0 0 0  Number falls in past yr: 0 0 0 0   Injury with Fall? 0 0 0 0   Risk for fall due to : No Fall Risks No Fall Risks No Fall Risks No Fall Risks   Follow up Falls prevention discussed;Falls evaluation completed Falls evaluation completed;Falls prevention discussed Falls evaluation completed;Falls prevention discussed Falls evaluation completed;Falls prevention discussed Falls evaluation completed    MEDICARE RISK AT HOME: Medicare Risk at Home Any stairs in or around the home?: No If so, are there any without handrails?: No Home free of loose throw rugs in walkways, pet beds, electrical cords, etc?: Yes Adequate lighting in your home to reduce risk of falls?: Yes Life alert?: No Use of a cane, walker or w/c?: No Grab bars in the bathroom?: No Shower chair or bench in shower?: No Elevated toilet seat or a handicapped toilet?: No  TIMED UP AND GO:  Was the test performed? No    Cognitive Function:    10/20/2023   11:28 AM  MMSE - Mini Mental State Exam  Not completed: Unable to complete        10/20/2023   11:28 AM  6CIT Screen  What Year? 0 points  What month? 0 points  What time? 0 points  Count back from 20 0 points  Months in reverse 0 points  Repeat phrase 0 points  Total Score 0 points    Immunizations Immunization History  Administered Date(s) Administered   Influenza Split 08/14/2011, 09/27/2012   Influenza Whole 08/19/2009, 06/02/2010   Influenza,inj,Quad PF,6+ Mos 05/20/2016, 05/24/2017,  06/13/2018, 06/26/2019, 07/17/2020, 05/19/2021   PFIZER(Purple Top)SARS-COV-2 Vaccination 11/27/2019, 12/18/2019, 08/26/2020   Pfizer(Comirnaty)Fall Seasonal Vaccine 12 years and older 07/01/2023   Pneumococcal Polysaccharide-23 09/19/2008   Tdap 07/20/2016   Zoster  Recombinant(Shingrix) 06/08/2019, 01/24/2020    TDAP status: Up to date  Flu Vaccine status: Due, Education has been provided regarding the importance of this vaccine. Advised may receive this vaccine at local pharmacy or Health Dept. Aware to provide a copy of the vaccination record if obtained from local pharmacy or Health Dept. Verbalized acceptance and understanding.  Pneumococcal vaccine status: Due, Education has been provided regarding the importance of this vaccine. Advised may receive this vaccine at local pharmacy or Health Dept. Aware to provide a copy of the vaccination record if obtained from local pharmacy or Health Dept. Verbalized acceptance and understanding.  Covid-19 vaccine status: Completed vaccines  Qualifies for Shingles Vaccine? Yes   Zostavax completed No   Shingrix Completed?: Yes  Screening Tests Health Maintenance  Topic Date Due   INFLUENZA VACCINE  04/08/2023   COVID-19 Vaccine (5 - 2024-25 season) 08/26/2023   Medicare Annual Wellness (AWV)  10/19/2024   Colonoscopy  06/20/2025   DTaP/Tdap/Td (2 - Td or Tdap) 07/20/2026   Hepatitis C Screening  Completed   HIV Screening  Completed   Zoster Vaccines- Shingrix  Completed   Pneumococcal Vaccine 57-65 Years old  Aged Out   HPV VACCINES  Aged Out    Health Maintenance  Health Maintenance Due  Topic Date Due   INFLUENZA VACCINE  04/08/2023   COVID-19 Vaccine (5 - 2024-25 season) 08/26/2023    Colorectal cancer screening: Type of screening: Colonoscopy. Completed 06/20/2020. Repeat every 5 years  Lung Cancer Screening: (Low Dose CT Chest recommended if Age 70-80 years, 20 pack-year currently smoking OR have quit w/in 15years.) does not qualify.   Lung Cancer Screening Referral: NO  Additional Screening:  Hepatitis C Screening: does qualify; Completed 06/17/2015  Vision Screening: Recommended annual ophthalmology exams for early detection of glaucoma and other disorders of the eye. Is the  patient up to date with their annual eye exam?  Yes  Who is the provider or what is the name of the office in which the patient attends annual eye exams? [employer], OD. If pt is not established with a provider, would they like to be referred to a provider to establish care? No .   Dental Screening: Recommended annual dental exams for proper oral hygiene.  Community Resource Referral / Chronic Care Management: CRR required this visit?  No   CCM required this visit?  No    Plan:     I have personally reviewed and noted the following in the patient's chart:   Medical and social history Use of alcohol, tobacco or illicit drugs  Current medications and supplements including opioid prescriptions. Patient is not currently taking opioid prescriptions. Functional ability and status Nutritional status Physical activity Advanced directives List of other physicians Hospitalizations, surgeries, and ER visits in previous 12 months Vitals Screenings to include cognitive, depression, and falls Referrals and appointments  In addition, I have reviewed and discussed with patient certain preventive protocols, quality metrics, and best practice recommendations. A written personalized care plan for preventive services as well as general preventive health recommendations were provided to patient.     Mickeal Needy, LPN   12/14/8117   After Visit Summary: (Declined) Due to this being a telephonic visit, with patients personalized plan was offered to patient but patient Declined AVS  at this time   Nurse Notes: Patient is due for a Flu Vaccine and Covid Vaccine.

## 2023-10-20 NOTE — Patient Instructions (Signed)
[name] , Thank you for taking time to come for your Medicare Wellness Visit. I appreciate your ongoing commitment to your health goals. Please review the following plan we discussed and let me know if I can assist you in the future.   Referrals/Orders/Follow-Ups/Clinician Recommendations: Yes; Keep maintaining your health by keeping your appointments with Dr. Oswaldo Done and any specialists that you may see.  Call us if you need anything.  Have a great year!!!!  This is a list of the screening recommended for you and due dates:  Health Maintenance  Topic Date Due   Flu Shot  04/08/2023   COVID-19 Vaccine (5 - 2024-25 season) 08/26/2023   Medicare Annual Wellness Visit  10/19/2024   Colon Cancer Screening  06/20/2025   DTaP/Tdap/Td vaccine (2 - Td or Tdap) 07/20/2026   Hepatitis C Screening  Completed   HIV Screening  Completed   Zoster (Shingles) Vaccine  Completed   Pneumococcal Vaccination  Aged Out   HPV Vaccine  Aged Out    Advanced directives: (Copy Requested) Please bring a copy of your health care power of attorney and living will to the office to be added to your chart at your convenience.  Next Medicare Annual Wellness Visit scheduled for next year: Yes

## 2023-10-21 ENCOUNTER — Telehealth: Payer: Self-pay | Admitting: *Deleted

## 2023-10-21 DIAGNOSIS — D509 Iron deficiency anemia, unspecified: Secondary | ICD-10-CM | POA: Insufficient documentation

## 2023-10-21 LAB — LIPID PANEL
Chol/HDL Ratio: 2.2 {ratio} (ref 0.0–5.0)
Cholesterol, Total: 202 mg/dL — ABNORMAL HIGH (ref 100–199)
HDL: 91 mg/dL (ref >39)
LDL Chol Calc (NIH): 101 mg/dL — ABNORMAL HIGH (ref 0–99)
Triglycerides: 53 mg/dL (ref 0–149)
VLDL Cholesterol Cal: 10 mg/dL (ref 5–40)

## 2023-10-21 LAB — CBC
Hematocrit: 37 % — ABNORMAL LOW (ref 37.5–51.0)
Hemoglobin: 10.1 g/dL — ABNORMAL LOW (ref 13.0–17.7)
MCH: 19.1 pg — ABNORMAL LOW (ref 26.6–33.0)
MCHC: 27.3 g/dL — ABNORMAL LOW (ref 31.5–35.7)
MCV: 70 fL — ABNORMAL LOW (ref 79–97)
Platelets: 400 10*3/uL (ref 150–450)
RBC: 5.28 x10E6/uL (ref 4.14–5.80)
RDW: 21 % — ABNORMAL HIGH (ref 11.6–15.4)
WBC: 3.8 10*3/uL (ref 3.4–10.8)

## 2023-10-21 LAB — CMP14 + ANION GAP
ALT: 24 [IU]/L (ref 0–44)
AST: 43 [IU]/L — ABNORMAL HIGH (ref 0–40)
Albumin: 4.7 g/dL (ref 3.9–4.9)
Alkaline Phosphatase: 110 [IU]/L (ref 44–121)
Anion Gap: 18 mmol/L (ref 10.0–18.0)
BUN/Creatinine Ratio: 18 (ref 10–24)
BUN: 10 mg/dL (ref 8–27)
Bilirubin Total: 0.4 mg/dL (ref 0.0–1.2)
CO2: 23 mmol/L (ref 20–29)
Calcium: 10.3 mg/dL — ABNORMAL HIGH (ref 8.6–10.2)
Chloride: 100 mmol/L (ref 96–106)
Creatinine, Ser: 0.57 mg/dL — ABNORMAL LOW (ref 0.76–1.27)
Globulin, Total: 3.6 g/dL (ref 1.5–4.5)
Glucose: 99 mg/dL (ref 70–99)
Potassium: 4 mmol/L (ref 3.5–5.2)
Sodium: 141 mmol/L (ref 134–144)
eGFR: 109 mL/min/{1.73_m2} (ref >59)

## 2023-10-21 LAB — IFE, PE AND FLC, SERUM
Albumin SerPl Elph-Mcnc: 4.1 g/dL (ref 2.9–4.4)
Albumin/Glob SerPl: 1 (ref 0.7–1.7)
Alpha 1: 0.2 g/dL (ref 0.0–0.4)
Alpha2 Glob SerPl Elph-Mcnc: 0.6 g/dL (ref 0.4–1.0)
B-Globulin SerPl Elph-Mcnc: 1.3 g/dL (ref 0.7–1.3)
Gamma Glob SerPl Elph-Mcnc: 2.1 g/dL — ABNORMAL HIGH (ref 0.4–1.8)
Globulin, Total: 4.2 g/dL — ABNORMAL HIGH (ref 2.2–3.9)
Ig Kappa Free Light Chain: 29.4 mg/L — ABNORMAL HIGH (ref 3.3–19.4)
Ig Lambda Free Light Chain: 14.1 mg/L (ref 5.7–26.3)
IgA/Immunoglobulin A, Serum: 436 mg/dL (ref 61–437)
IgG (Immunoglobin G), Serum: 2032 mg/dL — ABNORMAL HIGH (ref 603–1613)
IgM (Immunoglobulin M), Srm: 82 mg/dL (ref 20–172)
KAPPA/LAMBDA RATIO: 2.09 — ABNORMAL HIGH (ref 0.26–1.65)
M Protein SerPl Elph-Mcnc: 1.3 g/dL — ABNORMAL HIGH
Total Protein: 8.3 g/dL (ref 6.0–8.5)

## 2023-10-21 LAB — FERRITIN: Ferritin: 20 ng/mL — ABNORMAL LOW (ref 30–400)

## 2023-10-21 LAB — IRON AND TIBC
Iron Saturation: 2 % — CL (ref 15–55)
Iron: 13 ug/dL — ABNORMAL LOW (ref 38–169)
Total Iron Binding Capacity: 543 ug/dL — ABNORMAL HIGH (ref 250–450)
UIBC: 530 ug/dL — ABNORMAL HIGH (ref 111–343)

## 2023-10-21 LAB — TSH: TSH: 0.146 u[IU]/mL — ABNORMAL LOW (ref 0.450–4.500)

## 2023-10-21 LAB — T4, FREE: Free T4: 0.95 ng/dL (ref 0.82–1.77)

## 2023-10-21 LAB — SPECIMEN STATUS REPORT

## 2023-10-21 MED ORDER — POLYSACCHARIDE IRON COMPLEX 150 MG PO CAPS
150.0000 mg | ORAL_CAPSULE | Freq: Every day | ORAL | 1 refills | Status: AC
Start: 2023-10-21 — End: ?

## 2023-10-21 NOTE — Telephone Encounter (Signed)
Alert for abnormal lab results sent to patient's PCP.

## 2023-10-21 NOTE — Assessment & Plan Note (Signed)
CBC incidentally showed microcytic anemia. Iron studies show iron deficiency. He is not having bleeding or melena. I suspect this is from nutritional deficiency given his underweight. He had a screening colonoscopy just three years ago which was normal, no polyps. I will prescribe oral iron supplement for 8 weeks, and then he should have iron levels repeated.

## 2023-10-21 NOTE — Addendum Note (Signed)
Addended by: Erlinda Hong T on: 10/21/2023 02:46 PM   Modules accepted: Orders

## 2023-10-22 NOTE — Telephone Encounter (Signed)
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 Labs showing new iron deficiency anemia which was already addressed by Dr. Oswaldo Done. Renal function is normal with mild / borderline hypercalcemia. Since this is a known chronic issue being followed, ok to wait for PCP to address on Monday when he is back in the office. I will hold off on calling patient since I am not sure what prior discussions were had and I think probably best if Dr. Jacqualin Combes delivers the results. But given worsening gammopathy he may need further work up to rule out progression to MM (BM biopsy, NM bone scan, etc).

## 2023-10-26 NOTE — Addendum Note (Signed)
Addended by: Erlinda Hong T on: 10/26/2023 09:45 AM   Modules accepted: Orders

## 2023-10-26 NOTE — Progress Notes (Signed)
Addendum:  For MGUS, IFE shows an increasing IgG level and M spike. There is also a new iron deficiency anemia which I am treating. Calcium is at the upper limit of normal at 10.3. There is no other sign of organ damage, no other CRAB criteria. I have monitored the MGUS for several years now, at this point I am going to refer the patient to hematology for consultation, I think it is likely time to get a bone marrow biopsy to rule out smoldering myeloma.

## 2023-11-08 ENCOUNTER — Other Ambulatory Visit: Payer: Self-pay | Admitting: Medical Oncology

## 2023-11-08 DIAGNOSIS — D472 Monoclonal gammopathy: Secondary | ICD-10-CM

## 2023-11-09 ENCOUNTER — Inpatient Hospital Stay: Payer: 59

## 2023-11-09 ENCOUNTER — Inpatient Hospital Stay: Payer: 59 | Attending: Internal Medicine | Admitting: Internal Medicine

## 2023-11-09 ENCOUNTER — Other Ambulatory Visit: Payer: Self-pay | Admitting: Internal Medicine

## 2023-11-09 VITALS — BP 123/83 | HR 107 | Temp 98.7°F | Resp 15 | Ht 63.0 in | Wt 89.2 lb

## 2023-11-09 DIAGNOSIS — D472 Monoclonal gammopathy: Secondary | ICD-10-CM

## 2023-11-09 DIAGNOSIS — D508 Other iron deficiency anemias: Secondary | ICD-10-CM

## 2023-11-09 DIAGNOSIS — Z79899 Other long term (current) drug therapy: Secondary | ICD-10-CM | POA: Insufficient documentation

## 2023-11-09 DIAGNOSIS — D509 Iron deficiency anemia, unspecified: Secondary | ICD-10-CM | POA: Diagnosis not present

## 2023-11-09 DIAGNOSIS — E7800 Pure hypercholesterolemia, unspecified: Secondary | ICD-10-CM | POA: Diagnosis not present

## 2023-11-09 DIAGNOSIS — I10 Essential (primary) hypertension: Secondary | ICD-10-CM | POA: Insufficient documentation

## 2023-11-09 DIAGNOSIS — Z87891 Personal history of nicotine dependence: Secondary | ICD-10-CM | POA: Insufficient documentation

## 2023-11-09 LAB — CMP (CANCER CENTER ONLY)
ALT: 25 U/L (ref 0–44)
AST: 31 U/L (ref 15–41)
Albumin: 4.4 g/dL (ref 3.5–5.0)
Alkaline Phosphatase: 94 U/L (ref 38–126)
Anion gap: 8 (ref 5–15)
BUN: 5 mg/dL — ABNORMAL LOW (ref 8–23)
CO2: 30 mmol/L (ref 22–32)
Calcium: 10.1 mg/dL (ref 8.9–10.3)
Chloride: 99 mmol/L (ref 98–111)
Creatinine: 0.49 mg/dL — ABNORMAL LOW (ref 0.61–1.24)
GFR, Estimated: >60 mL/min (ref >60)
Glucose, Bld: 96 mg/dL (ref 70–99)
Potassium: 3.5 mmol/L (ref 3.5–5.1)
Sodium: 137 mmol/L (ref 135–145)
Total Bilirubin: 0.7 mg/dL (ref 0.0–1.2)
Total Protein: 8.6 g/dL — ABNORMAL HIGH (ref 6.5–8.1)

## 2023-11-09 LAB — FOLATE: Folate: 15.7 ng/mL (ref >5.9)

## 2023-11-09 LAB — CBC WITH DIFFERENTIAL (CANCER CENTER ONLY)
Abs Immature Granulocytes: 0.01 10*3/uL (ref 0.00–0.07)
Basophils Absolute: 0.1 10*3/uL (ref 0.0–0.1)
Basophils Relative: 1 %
Eosinophils Absolute: 0.1 10*3/uL (ref 0.0–0.5)
Eosinophils Relative: 3 %
HCT: 33.6 % — ABNORMAL LOW (ref 39.0–52.0)
Hemoglobin: 10.5 g/dL — ABNORMAL LOW (ref 13.0–17.0)
Immature Granulocytes: 0 %
Lymphocytes Relative: 28 %
Lymphs Abs: 1.2 10*3/uL (ref 0.7–4.0)
MCH: 19.9 pg — ABNORMAL LOW (ref 26.0–34.0)
MCHC: 31.3 g/dL (ref 30.0–36.0)
MCV: 63.8 fL — ABNORMAL LOW (ref 80.0–100.0)
Monocytes Absolute: 0.4 10*3/uL (ref 0.1–1.0)
Monocytes Relative: 8 %
Neutro Abs: 2.6 10*3/uL (ref 1.7–7.7)
Neutrophils Relative %: 60 %
Platelet Count: 422 10*3/uL — ABNORMAL HIGH (ref 150–400)
RBC: 5.27 MIL/uL (ref 4.22–5.81)
RDW: 21.1 % — ABNORMAL HIGH (ref 11.5–15.5)
Smear Review: NORMAL
WBC Count: 4.4 10*3/uL (ref 4.0–10.5)
nRBC: 0 % (ref 0.0–0.2)

## 2023-11-09 LAB — IRON AND IRON BINDING CAPACITY (CC-WL,HP ONLY)
Iron: 15 ug/dL — ABNORMAL LOW (ref 45–182)
Saturation Ratios: 3 % — ABNORMAL LOW (ref 17.9–39.5)
TIBC: 588 ug/dL — ABNORMAL HIGH (ref 250–450)
UIBC: 573 ug/dL — ABNORMAL HIGH (ref 117–376)

## 2023-11-09 LAB — VITAMIN B12: Vitamin B-12: 283 pg/mL (ref 180–914)

## 2023-11-09 LAB — LACTATE DEHYDROGENASE: LDH: 144 U/L (ref 98–192)

## 2023-11-09 LAB — FERRITIN: Ferritin: 10 ng/mL — ABNORMAL LOW (ref 24–336)

## 2023-11-09 NOTE — Progress Notes (Signed)
 [facility] [phone]   [phone]) [phone]  CONSULT NOTE  REFERRING PHYSICIAN: Dr. Mercie Eon  REASON FOR CONSULTATION:  63 years old African-American man with anemia and M spike  HPI Gregory Hernandez is a 63 y.o. male.   Discussed the use of AI scribe software for clinical note transcription with the patient, who gave verbal consent to proceed.  History of Present Illness   Gregory Hernandez "Gregory Hernandez" is a 63 year old male who presents with anemia and elevated protein levels. He is accompanied by his mother. He was referred by his primary care provider for evaluation of anemia and elevated protein levels.  Anemia was identified through blood work conducted on October 18, 2023, showing low hemoglobin and hematocrit levels. Further testing revealed low serum iron and elevated total iron binding capacity (TIBC). No fatigue, weakness, dizziness, nausea, vomiting, diarrhea, headaches, changes in vision, chest pain, or shortness of breath. No blood in stool, and a colonoscopy last year was unremarkable. He has experienced weight loss, currently weighing 89.2 pounds, down from 97 pounds. His mother notes muscle mass loss.  He has elevated protein levels in the blood. He has a dry cough for which he takes cough medicine.  His past medical history includes high blood pressure, high cholesterol, colon polyps, H. pylori infection, and past tuberculosis, which was treated. He denies any current symptoms related to these conditions.  Socially, he helps his mother around the house and works at [employer]. He does not smoke but drinks alcohol occasionally, consuming 40-ounce bottles when he does. He denies any drug abuse.  Family history is significant for his mother having high blood pressure, diabetes, and heart disease. His father, who is deceased, had asthma, possibly COPD. There is no known family history of cancer.        Past Medical History:  Diagnosis Date    Adenomatous colon polyp 12/14/2011   Dermatitis    H/O Tinea dermatitis   Elevated BP    Elevated transaminase level    Mild elevation.   Facial pain 2007   Left eye and left facial pain January 2007; evaluated by ophthalmologist Dr. Cecilie Kicks; CT of face and orbits 09/16/2005 unremarkable; etiology unclear; improved by 10/28/2005.   Fracture of left shoulder 2012   Helicobacter pylori gastritis 11/16/2014   EGD done 11/01/2014 by Dr. 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  Dr. Marge Duncans notes indicate plan to start treatment with Prevpac.    Rhinitis    Seasonal   TB (tuberculosis) 1996   Pulmonary TB (right upper lobe infiltrate) diagnosed May 1996; treated at [facility]   Tobacco abuse     Past Surgical History:  Procedure Laterality Date   NO PAST SURGERIES      Family History  Problem Relation Age of Onset   Hypertension Mother    Diabetes Mother    Heart attack Mother    Peptic Ulcer Mother    Arthritis Mother    Cancer Cousin     Social History Social History   Tobacco Use   Smoking status: Former    Current packs/day: 0.00    Average packs/day: 2.0 packs/day for 7.0 years (14.0 ttl pk-yrs)    Types: Cigarettes    Start date: 08/18/2002    Quit date: 08/18/2009    Years since quitting: 14.2   Smokeless tobacco: Never  Substance Use Topics   Alcohol  use: No    Alcohol/week: 0.0 standard drinks of alcohol    Comment: About 2-3 beers per day. 2 40 ounces a day   Drug use: No    No Known Allergies  Current Outpatient Medications  Medication Sig Dispense Refill   chlorthalidone (HYGROTON) 25 MG tablet TAKE 1 TABLET EVERY DAY 90 tablet 3   diclofenac Sodium (VOLTAREN) 1 % GEL Apply 2 g topically 4 (four) times daily. 100 g 0   iron polysaccharides (NIFEREX) 150 MG capsule Take 1 capsule (150 mg total) by mouth daily. 30 capsule 1   pravastatin (PRAVACHOL)  20 MG tablet TAKE 1 TABLET BY MOUTH EVERY DAY IN THE EVENING 90 tablet 3   No current facility-administered medications for this visit.    Review of Systems  Constitutional: positive for fatigue Eyes: negative Ears, nose, mouth, throat, and face: negative Respiratory: positive for cough Cardiovascular: negative Gastrointestinal: negative Genitourinary:negative Integument/breast: negative Hematologic/lymphatic: negative Musculoskeletal:negative Neurological: negative Behavioral/Psych: negative Endocrine: negative Allergic/Immunologic: negative  Physical Exam  ZOX:WRUEA, healthy, no distress, well developed, and malnourished SKIN: skin color, texture, turgor are normal, no rashes or significant lesions HEAD: Normocephalic, No masses, lesions, tenderness or abnormalities EYES: normal, PERRLA, Conjunctiva are pink and non-injected EARS: External ears normal, Canals clear OROPHARYNX:no exudate, no erythema, and lips, buccal mucosa, and tongue normal  NECK: supple, no adenopathy, no JVD LYMPH:  no palpable lymphadenopathy, no hepatosplenomegaly LUNGS: clear to auscultation , and palpation HEART: regular rate & rhythm, no murmurs, and no gallops ABDOMEN:abdomen soft, non-tender, normal bowel sounds, and no masses or organomegaly BACK: Back symmetric, no curvature., No CVA tenderness EXTREMITIES:no joint deformities, effusion, or inflammation, no edema  NEURO: alert & oriented x 3 with fluent speech, no focal motor/sensory deficits  PERFORMANCE STATUS: ECOG 1  LABORATORY DATA: Lab Results  Component Value Date   WBC 3.8 10/18/2023   HGB 10.1 (L) 10/18/2023   HCT 37.0 (L) 10/18/2023   MCV 70 (L) 10/18/2023   PLT 400 10/18/2023      Chemistry      Component Value Date/Time   NA 141 10/18/2023 1041   K 4.0 10/18/2023 1041   CL 100 10/18/2023 1041   CO2 23 10/18/2023 1041   BUN 10 10/18/2023 1041   CREATININE 0.57 (L) 10/18/2023 1041   CREATININE 0.58 09/19/2014 1046       Component Value Date/Time   CALCIUM 10.3 (H) 10/18/2023 1041   ALKPHOS 110 10/18/2023 1041   AST 43 (H) 10/18/2023 1041   ALT 24 10/18/2023 1041   BILITOT 0.4 10/18/2023 1041       RADIOGRAPHIC STUDIES: No results found.  ASSESSMENT AND PLAN:     Iron Deficiency Anemia Iron deficiency anemia identified with hemoglobin of 10.0 g/dL, hematocrit of 54.0%, serum iron of 13 mcg/dL, and TIBC of 981 mcg/dL. He denies symptoms such as fatigue, weakness, dizziness, nausea, vomiting, diarrhea, headaches, vision changes, chest pain, or shortness of breath. No blood in stool reported. Recent colonoscopy was performed last year. Discussed iron supplementation and potential further testing if anemia persists. - Order repeat blood work including ferritin, vitamin B12, and folate - Order anemia panel - Recommend iron supplementation: one iron tablet daily with vitamin C or orange juice - Follow-up in one week to review lab results  Monoclonal Gammopathy of Undetermined Significance (MGUS) Elevated protein in blood noted with M-spike of 1.3, raising concern for MGUS versus multiple myeloma. He is asymptomatic with no complaints of bone pain or other related symptoms. Discussed  potential need for bone marrow biopsy if further tests indicate. Explained that MGUS can progress to multiple myeloma, requiring more intensive treatment. - Order Myeloma Panel. - Consider bone marrow biopsy if indicated by further test results - Follow-up in one week to review lab results and determine next steps  Chronic Conditions Hypertension, hyperlipidemia, colon polyps, H. pylori infection, and past tuberculosis. No current symptoms or active management discussed for these conditions during this visit. - Continue current management for hypertension and hyperlipidemia as per primary care provider - Ensure regular follow-up for colon polyps and H. pylori as per gastroenterologist  General Health  Maintenance History of alcohol consumption but denies smoking or drug abuse. - Encourage moderation in alcohol consumption - Continue regular health screenings and follow-ups as per primary care provider  Follow-up - Follow-up appointment in one week to review lab results and determine further management steps - Consider referral to hematology/oncology for further evaluation if indicated by lab results.   The patient was advised to call immediately if she has any other concerning symptoms in the interval. The patient voices understanding of current disease status and treatment options and is in agreement with the current care plan.  All questions were answered. The patient knows to call the clinic with any problems, questions or concerns. We can certainly see the patient much sooner if necessary.  Thank you so much for allowing me to participate in the care of [employer]. I will continue to follow up the patient with you and assist in his care.  The total time spent in the appointment was 60 minutes.  Disclaimer: This note was dictated with voice recognition software. Similar sounding words can inadvertently be transcribed and may not be corrected upon review.   Lajuana Matte November 09, 2023, 11:43 AM

## 2023-11-10 LAB — IGG, IGA, IGM
IgA: 472 mg/dL — ABNORMAL HIGH (ref 61–437)
IgG (Immunoglobin G), Serum: 1976 mg/dL — ABNORMAL HIGH (ref 603–1613)
IgM (Immunoglobulin M), Srm: 91 mg/dL (ref 20–172)

## 2023-11-10 LAB — KAPPA/LAMBDA LIGHT CHAINS
Kappa free light chain: 28.7 mg/L — ABNORMAL HIGH (ref 3.3–19.4)
Kappa, lambda light chain ratio: 2.05 — ABNORMAL HIGH (ref 0.26–1.65)
Lambda free light chains: 14 mg/L (ref 5.7–26.3)

## 2023-11-10 LAB — BETA 2 MICROGLOBULIN, SERUM: Beta-2 Microglobulin: 1.2 mg/L (ref 0.6–2.4)

## 2023-11-16 LAB — PROTEIN ELECTROPHORESIS, SERUM, WITH REFLEX
A/G Ratio: 1 (ref 0.7–1.7)
Albumin ELP: 4.2 g/dL (ref 2.9–4.4)
Alpha-1-Globulin: 0.3 g/dL (ref 0.0–0.4)
Alpha-2-Globulin: 0.7 g/dL (ref 0.4–1.0)
Beta Globulin: 1.3 g/dL (ref 0.7–1.3)
Gamma Globulin: 2 g/dL — ABNORMAL HIGH (ref 0.4–1.8)
Globulin, Total: 4.3 g/dL — ABNORMAL HIGH (ref 2.2–3.9)
M-Spike, %: 1.2 g/dL — ABNORMAL HIGH
SPEP Interpretation: 0
Total Protein ELP: 8.5 g/dL (ref 6.0–8.5)

## 2023-11-16 LAB — IMMUNOFIXATION REFLEX, SERUM
IgA: 446 mg/dL — ABNORMAL HIGH (ref 61–437)
IgG (Immunoglobin G), Serum: 1977 mg/dL — ABNORMAL HIGH (ref 603–1613)
IgM (Immunoglobulin M), Srm: 89 mg/dL (ref 20–172)

## 2023-12-14 ENCOUNTER — Inpatient Hospital Stay (HOSPITAL_BASED_OUTPATIENT_CLINIC_OR_DEPARTMENT_OTHER): Admitting: Internal Medicine

## 2023-12-14 ENCOUNTER — Inpatient Hospital Stay: Attending: Internal Medicine

## 2023-12-14 VITALS — BP 137/85 | HR 69 | Temp 98.1°F | Resp 16 | Ht 63.0 in | Wt 89.2 lb

## 2023-12-14 DIAGNOSIS — D472 Monoclonal gammopathy: Secondary | ICD-10-CM | POA: Diagnosis not present

## 2023-12-14 DIAGNOSIS — D509 Iron deficiency anemia, unspecified: Secondary | ICD-10-CM | POA: Insufficient documentation

## 2023-12-14 DIAGNOSIS — D500 Iron deficiency anemia secondary to blood loss (chronic): Secondary | ICD-10-CM

## 2023-12-14 DIAGNOSIS — D508 Other iron deficiency anemias: Secondary | ICD-10-CM

## 2023-12-14 DIAGNOSIS — Z79899 Other long term (current) drug therapy: Secondary | ICD-10-CM | POA: Diagnosis not present

## 2023-12-14 LAB — IRON AND IRON BINDING CAPACITY (CC-WL,HP ONLY)
Iron: 68 ug/dL (ref 45–182)
Saturation Ratios: 14 % — ABNORMAL LOW (ref 17.9–39.5)
TIBC: 472 ug/dL — ABNORMAL HIGH (ref 250–450)
UIBC: 404 ug/dL — ABNORMAL HIGH (ref 117–376)

## 2023-12-14 LAB — CBC WITH DIFFERENTIAL (CANCER CENTER ONLY)
Abs Immature Granulocytes: 0 10*3/uL (ref 0.00–0.07)
Basophils Absolute: 0 10*3/uL (ref 0.0–0.1)
Basophils Relative: 1 %
Eosinophils Absolute: 0.6 10*3/uL — ABNORMAL HIGH (ref 0.0–0.5)
Eosinophils Relative: 13 %
HCT: 39.7 % (ref 39.0–52.0)
Hemoglobin: 12.6 g/dL — ABNORMAL LOW (ref 13.0–17.0)
Immature Granulocytes: 0 %
Lymphocytes Relative: 39 %
Lymphs Abs: 1.6 10*3/uL (ref 0.7–4.0)
MCH: 22.9 pg — ABNORMAL LOW (ref 26.0–34.0)
MCHC: 31.7 g/dL (ref 30.0–36.0)
MCV: 72.2 fL — ABNORMAL LOW (ref 80.0–100.0)
Monocytes Absolute: 0.6 10*3/uL (ref 0.1–1.0)
Monocytes Relative: 13 %
Neutro Abs: 1.4 10*3/uL — ABNORMAL LOW (ref 1.7–7.7)
Neutrophils Relative %: 34 %
Platelet Count: 294 10*3/uL (ref 150–400)
RBC: 5.5 MIL/uL (ref 4.22–5.81)
RDW: 27.7 % — ABNORMAL HIGH (ref 11.5–15.5)
WBC Count: 4.2 10*3/uL (ref 4.0–10.5)
nRBC: 0 % (ref 0.0–0.2)

## 2023-12-14 LAB — FERRITIN: Ferritin: 56 ng/mL (ref 24–336)

## 2023-12-14 NOTE — Progress Notes (Signed)
 [facility] [phone]   Fax:([phone]  OFFICE PROGRESS NOTE  Mercie Eon, MD [address] 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  INTERVAL HISTORY: Gregory Hernandez 63 y.o. male returns to the clinic today for follow-up visit accompanied by his mother. Discussed the use of AI scribe software for clinical note transcription with the patient, who gave verbal consent to proceed.  History of Present Illness   Gregory Hernandez "Gregory Hernandez" is a 63 year old male with iron deficiency anemia and MGUS who presents for follow-up of his anemia.  He is currently taking over-the-counter ferrous sulfate with vitamin C every morning to manage his iron deficiency anemia. He feels 'very good' with this treatment and adheres to the regimen daily, taking it with orange juice or vitamin C. His hemoglobin levels have improved from 10.1 to 12.6 since the last visit. He has gained two pounds. No symptoms of bleeding, such as blood in the stool, epistaxis, or gum bleeding. No dizzy spells. His nutrition is good, humorously noting that he 'eats like a pig'.  He has a history of MGUS, which is being monitored due to its potential to progress to multiple myeloma. There is no indication of leukemia.       MEDICAL HISTORY: Past Medical History:  Diagnosis Date   Adenomatous colon polyp 12/14/2011   Dermatitis    H/O Tinea dermatitis   Elevated BP    Elevated transaminase level    Mild elevation.   Facial pain 2007   Left eye and left facial pain January 2007; evaluated by ophthalmologist Dr. Cecilie Kicks; CT of face and orbits 09/16/2005 unremarkable; etiology unclear; improved by 10/28/2005.   Fracture of left shoulder 2012   Helicobacter pylori gastritis 11/16/2014   EGD done 11/01/2014 by Dr. Bosie Clos to work up weight loss  showed gastritis; biopsy showed chronic active gastritis with Helicobacter pylori organisms.  Dr. Marge Duncans notes indicate plan to start treatment with Prevpac.    Rhinitis    Seasonal   TB (tuberculosis) 1996   Pulmonary TB (right upper lobe infiltrate) diagnosed May 1996; treated at [facility]   Tobacco abuse     ALLERGIES:  has no known allergies.  MEDICATIONS:  Current Outpatient Medications  Medication Sig Dispense Refill   chlorthalidone (HYGROTON) 25 MG tablet TAKE 1 TABLET EVERY DAY 90 tablet 3   diclofenac Sodium (VOLTAREN) 1 % GEL Apply 2 g topically 4 (four) times daily. 100 g 0   iron polysaccharides (NIFEREX) 150 MG capsule Take 1 capsule (150 mg total) by mouth daily. 30 capsule 1   pravastatin (PRAVACHOL) 20 MG tablet TAKE 1 TABLET BY MOUTH EVERY DAY IN THE EVENING 90 tablet 3   No current facility-administered medications for this visit.    SURGICAL HISTORY:  Past Surgical History:  Procedure Laterality Date   NO PAST SURGERIES      REVIEW OF SYSTEMS:  A comprehensive review of systems was negative.   PHYSICAL EXAMINATION: General appearance: alert, cooperative, fatigued, and no distress Head: Normocephalic, without obvious abnormality, atraumatic Neck: no adenopathy, no JVD, supple, symmetrical, trachea midline, and thyroid not enlarged, symmetric, no tenderness/mass/nodules Lymph nodes: Cervical, supraclavicular, and axillary nodes normal. Resp: clear to auscultation bilaterally Back: symmetric, no curvature. ROM normal. No CVA tenderness. Cardio: regular rate and rhythm, S1,  S2 normal, no murmur, click, rub or gallop GI: soft, non-tender; bowel sounds normal; no masses,  no organomegaly Extremities: extremities normal, atraumatic, no cyanosis or edema  ECOG PERFORMANCE STATUS: 1 - Symptomatic but completely ambulatory  Blood pressure 137/85, pulse 69, temperature 98.1 F (36.7 C), temperature source Temporal, resp. rate 16, height  5\' 3"  (1.6 m), weight 89 lb 3.2 oz (40.5 kg), SpO2 100%.  LABORATORY DATA: Lab Results  Component Value Date   WBC 4.2 12/14/2023   HGB 12.6 (L) 12/14/2023   HCT 39.7 12/14/2023   MCV 72.2 (L) 12/14/2023   PLT 294 12/14/2023      Chemistry      Component Value Date/Time   NA 137 11/09/2023 1120   NA 141 10/18/2023 1041   K 3.5 11/09/2023 1120   CL 99 11/09/2023 1120   CO2 30 11/09/2023 1120   BUN 5 (L) 11/09/2023 1120   BUN 10 10/18/2023 1041   CREATININE 0.49 (L) 11/09/2023 1120   CREATININE 0.58 09/19/2014 1046      Component Value Date/Time   CALCIUM 10.1 11/09/2023 1120   ALKPHOS 94 11/09/2023 1120   AST 31 11/09/2023 1120   ALT 25 11/09/2023 1120   BILITOT 0.7 11/09/2023 1120       RADIOGRAPHIC STUDIES: No results found.  ASSESSMENT AND PLAN:    Iron deficiency anemia Iron deficiency anemia is improving with current treatment. Hemoglobin has increased from 10.1 to 12.6. He reports no symptoms such as blood in stool, epistaxis, or gum bleeding, and he is tolerating the iron supplementation well without any side effects like dizziness. - Continue over-the-counter ferrous sulfate with vitamin C daily - Advise taking iron with vitamin C or orange juice to enhance absorption - Monitor hemoglobin levels and clinical symptoms - Follow-up in three months to reassess hemoglobin levels  Monoclonal gammopathy of undetermined significance (MGUS) MGUS is present with a potential progression to multiple myeloma at a rate of 1% per year. Currently, there is no evidence of progression to multiple myeloma. Regular monitoring is essential to detect any signs of progression. - Monitor for signs of progression to multiple myeloma - Repeat blood work including myeloma panel in three months   The patient was advised to call immediately if he has any concerning symptoms in the interval. The patient voices understanding of current disease status and treatment options and is in  agreement with the current care plan.  All questions were answered. The patient knows to call the clinic with any problems, questions or concerns. We can certainly see the patient much sooner if necessary.  The total time spent in the appointment was 20 minutes.  Disclaimer: This note was dictated with voice recognition software. Similar sounding words can inadvertently be transcribed and may not be corrected upon review.

## 2024-03-07 ENCOUNTER — Telehealth: Payer: Self-pay

## 2024-03-07 ENCOUNTER — Inpatient Hospital Stay

## 2024-03-07 NOTE — Telephone Encounter (Signed)
 Patient left a voicemail with our office requesting a return call. Attempted to reach patient but was unsuccessful.  Spoke with patient's mother, Emelia, who reported that patient has an appointment today and another on 03/14/24. Informed Ruthie that I would move today's lab appointment to 03/14/24 prior to the visit with Dr. Sherrod.  Appointments confirmed for 03/14/24: Labs at 3:15 PM and visit with Dr. Sherrod at 3:45 PM. She voiced understanding.

## 2024-03-14 ENCOUNTER — Inpatient Hospital Stay: Attending: Internal Medicine

## 2024-03-14 ENCOUNTER — Inpatient Hospital Stay: Attending: Internal Medicine | Admitting: Internal Medicine

## 2024-03-14 DIAGNOSIS — D472 Monoclonal gammopathy: Secondary | ICD-10-CM | POA: Insufficient documentation

## 2024-03-14 DIAGNOSIS — Z79899 Other long term (current) drug therapy: Secondary | ICD-10-CM | POA: Diagnosis not present

## 2024-03-14 DIAGNOSIS — D509 Iron deficiency anemia, unspecified: Secondary | ICD-10-CM | POA: Diagnosis present

## 2024-03-14 DIAGNOSIS — D500 Iron deficiency anemia secondary to blood loss (chronic): Secondary | ICD-10-CM

## 2024-03-14 LAB — CBC WITH DIFFERENTIAL (CANCER CENTER ONLY)
Abs Immature Granulocytes: 0 K/uL (ref 0.00–0.07)
Basophils Absolute: 0 K/uL (ref 0.0–0.1)
Basophils Relative: 1 %
Eosinophils Absolute: 0.5 K/uL (ref 0.0–0.5)
Eosinophils Relative: 10 %
HCT: 38.7 % — ABNORMAL LOW (ref 39.0–52.0)
Hemoglobin: 13.4 g/dL (ref 13.0–17.0)
Immature Granulocytes: 0 %
Lymphocytes Relative: 32 %
Lymphs Abs: 1.7 K/uL (ref 0.7–4.0)
MCH: 27.2 pg (ref 26.0–34.0)
MCHC: 34.6 g/dL (ref 30.0–36.0)
MCV: 78.7 fL — ABNORMAL LOW (ref 80.0–100.0)
Monocytes Absolute: 0.6 K/uL (ref 0.1–1.0)
Monocytes Relative: 11 %
Neutro Abs: 2.5 K/uL (ref 1.7–7.7)
Neutrophils Relative %: 46 %
Platelet Count: 250 K/uL (ref 150–400)
RBC: 4.92 MIL/uL (ref 4.22–5.81)
RDW: 14.7 % (ref 11.5–15.5)
WBC Count: 5.3 K/uL (ref 4.0–10.5)
nRBC: 0 % (ref 0.0–0.2)

## 2024-03-14 LAB — CMP (CANCER CENTER ONLY)
ALT: 27 U/L (ref 0–44)
AST: 22 U/L (ref 15–41)
Albumin: 4.1 g/dL (ref 3.5–5.0)
Alkaline Phosphatase: 78 U/L (ref 38–126)
Anion gap: 6 (ref 5–15)
BUN: 8 mg/dL (ref 8–23)
CO2: 29 mmol/L (ref 22–32)
Calcium: 10.1 mg/dL (ref 8.9–10.3)
Chloride: 103 mmol/L (ref 98–111)
Creatinine: 0.62 mg/dL (ref 0.61–1.24)
GFR, Estimated: >60 mL/min (ref >60)
Glucose, Bld: 127 mg/dL — ABNORMAL HIGH (ref 70–99)
Potassium: 3.5 mmol/L (ref 3.5–5.1)
Sodium: 138 mmol/L (ref 135–145)
Total Bilirubin: 0.8 mg/dL (ref 0.0–1.2)
Total Protein: 7.7 g/dL (ref 6.5–8.1)

## 2024-03-14 LAB — IRON AND IRON BINDING CAPACITY (CC-WL,HP ONLY)
Iron: 84 ug/dL (ref 45–182)
Saturation Ratios: 24 % (ref 17.9–39.5)
TIBC: 357 ug/dL (ref 250–450)
UIBC: 273 ug/dL (ref 117–376)

## 2024-03-14 LAB — LACTATE DEHYDROGENASE: LDH: 156 U/L (ref 98–192)

## 2024-03-15 LAB — KAPPA/LAMBDA LIGHT CHAINS
Kappa free light chain: 35.3 mg/L — ABNORMAL HIGH (ref 3.3–19.4)
Kappa, lambda light chain ratio: 2.97 — ABNORMAL HIGH (ref 0.26–1.65)
Lambda free light chains: 11.9 mg/L (ref 5.7–26.3)

## 2024-03-15 LAB — BETA 2 MICROGLOBULIN, SERUM: Beta-2 Microglobulin: 1.6 mg/L (ref 0.6–2.4)

## 2024-03-15 LAB — FERRITIN: Ferritin: 121 ng/mL (ref 24–336)

## 2024-03-16 LAB — IGG, IGA, IGM
IgA: 337 mg/dL (ref 61–437)
IgG (Immunoglobin G), Serum: 1911 mg/dL — ABNORMAL HIGH (ref 603–1613)
IgM (Immunoglobulin M), Srm: 72 mg/dL (ref 20–172)

## 2024-03-22 ENCOUNTER — Encounter: Payer: Self-pay | Admitting: *Deleted

## 2024-03-29 ENCOUNTER — Inpatient Hospital Stay (HOSPITAL_BASED_OUTPATIENT_CLINIC_OR_DEPARTMENT_OTHER): Admitting: Physician Assistant

## 2024-03-29 VITALS — BP 110/84 | HR 91 | Temp 97.3°F | Resp 18 | Wt 94.9 lb

## 2024-03-29 DIAGNOSIS — D509 Iron deficiency anemia, unspecified: Secondary | ICD-10-CM | POA: Diagnosis not present

## 2024-03-29 DIAGNOSIS — D472 Monoclonal gammopathy: Secondary | ICD-10-CM

## 2024-03-29 DIAGNOSIS — D508 Other iron deficiency anemias: Secondary | ICD-10-CM | POA: Diagnosis not present

## 2024-03-29 NOTE — Progress Notes (Signed)
 [facility] OFFICE PROGRESS NOTE  Gregory Sick, MD [address] 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 male returns to clinic today for follow-up visit.  The patient was last seen in clinic in April 2025.  The patient is followed for his history of iron  deficiency anemia and MGUS.  The patient is feeling well today without any concerning complaints.  He is compliant with his ferrous sulfate supplement daily.  He takes this with orange juice.  He denies any unusual fatigue. He has been exercising. He also gained weight. He denies any abnormal bleeding or bruising.  He denies any lymphadenopathy, fevers, chills, night sweats, or unexplained weight loss.  He denies any abdominal pain or early satiety.  He denies any unusual shortness of breath.  He denies any frequent signs and symptoms of infection.  He denies any unusual bone pain or recent fractures. He is here for evaluation and to review his scan results.    MEDICAL HISTORY: Past Medical History:  Diagnosis Date   Adenomatous colon polyp 12/14/2011   Dermatitis    H/O Tinea dermatitis   Elevated BP    Elevated transaminase level    Mild elevation.   Facial pain 2007   Left eye and left facial pain January 2007; evaluated by ophthalmologist Dr. Renaee; CT of face and orbits 09/16/2005 unremarkable; etiology unclear; improved by 10/28/2005.   Fracture of left shoulder 2012   Helicobacter pylori gastritis 11/16/2014   EGD done 11/01/2014 by Dr. Dianna to work up weight loss showed gastritis; biopsy showed chronic active gastritis with Helicobacter pylori organisms.  Dr. Jeronimo notes indicate plan to start treatment with Prevpac.    Rhinitis    Seasonal   TB (tuberculosis) 1996   Pulmonary TB (right upper lobe infiltrate)  diagnosed May 1996; treated at [facility]   Tobacco abuse     ALLERGIES:  has no known allergies.  MEDICATIONS:  Current Outpatient Medications  Medication Sig Dispense Refill   chlorthalidone  (HYGROTON ) 25 MG tablet TAKE 1 TABLET EVERY DAY 90 tablet 3   diclofenac  Sodium (VOLTAREN ) 1 % GEL Apply 2 g topically 4 (four) times daily. 100 g 0   iron  polysaccharides (NIFEREX) 150 MG capsule Take 1 capsule (150 mg total) by mouth daily. 30 capsule 1   pravastatin  (PRAVACHOL ) 20 MG tablet TAKE 1 TABLET BY MOUTH EVERY DAY IN THE EVENING 90 tablet 3   No current facility-administered medications for this visit.    SURGICAL HISTORY:  Past Surgical History:  Procedure Laterality Date   NO PAST SURGERIES      REVIEW OF SYSTEMS:   Review of Systems  Constitutional: Negative for appetite change, chills, fatigue, fever and unexpected weight change.  HENT: Negative for mouth sores, nosebleeds, sore throat and trouble swallowing.   Eyes: Negative for eye problems and icterus.  Respiratory: Negative for cough, hemoptysis, shortness of breath and wheezing.   Cardiovascular: Negative for chest pain and leg swelling.  Gastrointestinal: Negative for abdominal pain, constipation, diarrhea, nausea and vomiting.  Genitourinary: Negative for bladder incontinence, difficulty urinating, dysuria, frequency and hematuria.   Musculoskeletal: Negative for back pain, gait problem, neck pain and neck stiffness.  Skin: Negative for itching and rash.  Neurological: Negative for dizziness, extremity weakness, gait problem, headaches, light-headedness and seizures.  Hematological: Negative for adenopathy. Does not bruise/bleed easily.  Psychiatric/Behavioral: Negative for confusion, depression and sleep disturbance. The patient is not nervous/anxious.     PHYSICAL EXAMINATION:  There were no vitals taken for this visit.  ECOG PERFORMANCE STATUS: 0  Physical Exam  Constitutional:  Oriented to person, place, and time and thin appearing male and in no distress.  HENT:  Head: Normocephalic and atraumatic.  Mouth/Throat: Oropharynx is clear and moist. No oropharyngeal exudate.  Eyes: Conjunctivae are normal. Right eye exhibits no discharge. Left eye exhibits no discharge. No scleral icterus.  Neck: Normal range of motion. Neck supple.  Cardiovascular: Normal rate, regular rhythm, normal heart sounds and intact distal pulses.   Pulmonary/Chest: Effort normal and breath sounds normal. No respiratory distress. No wheezes. No rales.  Abdominal: Soft. Bowel sounds are normal. Exhibits no distension and no mass. There is no tenderness.  Musculoskeletal: Normal range of motion. Exhibits no edema.  Lymphadenopathy:    No cervical adenopathy.  Neurological: Alert and oriented to person, place, and time. Exhibits normal muscle tone. Gait normal. Coordination normal.  Skin: Skin is warm and dry. No rash noted. Not diaphoretic. No erythema. No pallor.  Psychiatric: Mood, memory and judgment normal.  Vitals reviewed.  LABORATORY DATA: Lab Results  Component Value Date   WBC 5.3 03/14/2024   HGB 13.4 03/14/2024   HCT 38.7 (L) 03/14/2024   MCV 78.7 (L) 03/14/2024   PLT 250 03/14/2024      Chemistry      Component Value Date/Time   NA 138 03/14/2024 1527   NA 141 10/18/2023 1041   K 3.5 03/14/2024 1527   CL 103 03/14/2024 1527   CO2 29 03/14/2024 1527   BUN 8 03/14/2024 1527   BUN 10 10/18/2023 1041   CREATININE 0.62 03/14/2024 1527   CREATININE 0.58 09/19/2014 1046      Component Value Date/Time   CALCIUM 10.1 03/14/2024 1527   ALKPHOS 78 03/14/2024 1527   AST 22 03/14/2024 1527   ALT 27 03/14/2024 1527   BILITOT 0.8 03/14/2024 1527       RADIOGRAPHIC STUDIES:  No results found.   ASSESSMENT/PLAN:  This is a very pleasant 64 year old African-American male with MGUS and iron  deficiency anemia.  The patient is taking ferrous sulfate p.o. daily with vitamin  C.  The patient was seen with Dr. Sherrod today.  Dr. Sherrod reviewed his myeloma labs.  Dr. Sherrod reviewed that this appears stable and would recommend repeat labs in 6 months.  The patient will have labs at that time and we will see the patient back 1 week later.  The patient will continue taking his iron  supplement.  The patient was advised to call immediately if she has any concerning symptoms in the interval. The patient voices understanding of current disease status and treatment options and is in agreement with the current care plan. All questions were answered. The patient knows to call the clinic with any problems, questions or concerns. We can certainly see the patient much sooner if necessary   No orders of the defined types were placed in this encounter.     Gregory Kasa L Nicodemus Denk, PA-C 03/29/24  ADDENDUM: Hematology/Oncology Attending: I had a face-to-face encounter with the patient today.  I reviewed his records, lab and recommended his care plan.  This is a very pleasant 63 years old African-American male with history of MGUS as well as iron  deficiency anemia.  He is currently on over-the-counter ferrous sulfate with vitamin C and  tolerating it fairly well.  He is here today for evaluation with repeat myeloma panel.  I discussed the lab result with the patient and there is no concerning findings for disease progression on his recent myeloma panel. I recommended for him to continue on observation for the MGUS in addition to the oral ferrous sulfate for the iron  deficiency anemia. We will see the patient back for follow-up visit in 6 months for evaluation and repeat blood work. He was advised to call immediately if he has any other concerning symptoms in the interval. The total time spent in the appointment was 20 minutes including review of chart and various tests results, discussions about plan of care and coordination of care plan . Disclaimer: This note was dictated  with voice recognition software. Similar sounding words can inadvertently be transcribed and may be missed upon review. Sherrod MARLA Sherrod, MD

## 2024-04-17 ENCOUNTER — Telehealth: Payer: Self-pay | Admitting: *Deleted

## 2024-04-17 NOTE — Telephone Encounter (Signed)
 Please schedule pt an appt per Dr Shawn.

## 2024-04-17 NOTE — Telephone Encounter (Signed)
 Copied from CRM [id number]. Topic: General - Other >> Apr 17, 2024  9:47 AM Mercer PEDLAR wrote: Reason for CRM: [facility] home nurse calling during AWV to report results of PVD/ PAD - quantaflo. Left foot 1.15, right foot showed mild borderline circulation low 0.87. She stated that she needs to notify if results is lower than 0.90. Also reports, BMI is 17.19.  Tiffany stated for any follow up needed to contact the patient directly.

## 2024-04-20 NOTE — Telephone Encounter (Signed)
 Called pt - informed pt his doctor wants him to schedule an appt. Denies leg pain or weakness. Call transferred to front office -

## 2024-09-02 ENCOUNTER — Encounter: Payer: Self-pay | Admitting: *Deleted

## 2024-09-02 ENCOUNTER — Ambulatory Visit (INDEPENDENT_AMBULATORY_CARE_PROVIDER_SITE_OTHER)

## 2024-09-02 ENCOUNTER — Ambulatory Visit: Admission: EM | Admit: 2024-09-02 | Discharge: 2024-09-02 | Disposition: A | Discharge status: Home / Self Care

## 2024-09-02 DIAGNOSIS — R059 Cough, unspecified: Secondary | ICD-10-CM

## 2024-09-02 DIAGNOSIS — J069 Acute upper respiratory infection, unspecified: Secondary | ICD-10-CM | POA: Diagnosis not present

## 2024-09-02 DIAGNOSIS — B9689 Other specified bacterial agents as the cause of diseases classified elsewhere: Secondary | ICD-10-CM | POA: Diagnosis not present

## 2024-09-02 MED ORDER — PREDNISONE 50 MG PO TABS: ORAL_TABLET | ORAL | 0 refills | Status: DC

## 2024-09-02 MED ORDER — BENZONATATE 100 MG PO CAPS: 100.0000 mg | ORAL_CAPSULE | Freq: Three times a day (TID) | ORAL | 0 refills | Status: AC

## 2024-09-02 MED ORDER — GUAIFENESIN ER 600 MG PO TB12: 600.0000 mg | ORAL_TABLET | Freq: Two times a day (BID) | ORAL | 0 refills | Status: AC

## 2024-09-02 MED ORDER — AZITHROMYCIN 250 MG PO TABS: 250.0000 mg | ORAL_TABLET | Freq: Every day | ORAL | 0 refills | Status: DC

## 2024-09-02 NOTE — ED Triage Notes (Signed)
 Pt reports he has been coughing since Thanksgiving. Cough keeps him up at night. Chest hurts with cough. Denies fever. He has appt to see his doctor in January. He has tried cough syrup, home remedies without relief

## 2024-09-02 NOTE — ED Provider Notes (Signed)
 " [facility]    CSN: [id number] Arrival date & time: 09/02/24  1037      History   Chief Complaint Chief Complaint  Patient presents with   Cough    HPI Gregory Hernandez is a 63 y.o. male.   Pt presents today due to cough productive of white sputum since November. Pt denies fever, chills, or shortness of breath. Pt does states that his chest hurts when he coughs. Pt states that he has taken OTC cold medicine for symptoms with no significant relief.   The history is provided by the patient.  Cough   Past Medical History:  Diagnosis Date   Adenomatous colon polyp 12/14/2011   Dermatitis    H/O Tinea dermatitis   Elevated BP    Elevated transaminase level    Mild elevation.   Facial pain 2007   Left eye and left facial pain January 2007; evaluated by ophthalmologist Dr. Renaee; CT of face and orbits 09/16/2005 unremarkable; etiology unclear; improved by 10/28/2005.   Fracture of left shoulder 2012   Helicobacter pylori gastritis 11/16/2014   EGD done 11/01/2014 by Dr. Dianna to work up weight loss showed gastritis; biopsy showed chronic active gastritis with Helicobacter pylori organisms.  Dr. Jeronimo notes indicate plan to start treatment with Prevpac.    Rhinitis    Seasonal   TB (tuberculosis) 1996   Pulmonary TB (right upper lobe infiltrate) diagnosed May 1996; treated at [facility]   Tobacco abuse     Patient Active Problem List   Diagnosis Date Noted   Iron  deficiency anemia 10/21/2023   Arm mass, left 10/18/2023   Osteoarthritis of right knee 07/17/2020   Thyroid  nodule 06/13/2018   Osteoarthritis of left knee 11/23/2016   MGUS (monoclonal gammopathy of unknown significance) 03/09/2016   Subclinical hyperthyroidism 10/04/2015   Hyperlipidemia 06/17/2015   Alcohol Use Disorder 06/17/2015   Loss of weight 05/23/2014   Preventative health care 08/19/2011   Hypertension 09/21/2008    Past Surgical History:  Procedure  Laterality Date   NO PAST SURGERIES         Home Medications    Prior to Admission medications  Medication Sig Start Date End Date Taking? Authorizing Provider  chlorthalidone  (HYGROTON ) 25 MG tablet TAKE 1 TABLET EVERY DAY 03/03/22  Yes Jerrell Cleatus Debby, MD  diclofenac  Sodium (VOLTAREN ) 1 % GEL Apply 2 g topically 4 (four) times daily. 06/16/23  Yes Tawkaliyar, Roya, DO  iron  polysaccharides (NIFEREX) 150 MG capsule Take 1 capsule (150 mg total) by mouth daily. 10/21/23  Yes Jerrell Cleatus Debby, MD  pravastatin  (PRAVACHOL ) 20 MG tablet TAKE 1 TABLET BY MOUTH EVERY DAY IN THE EVENING 10/19/23  Yes Jerrell Cleatus Debby, MD    Family History Family History  Problem Relation Age of Onset   Hypertension Mother    Diabetes Mother    Heart attack Mother    Peptic Ulcer Mother    Arthritis Mother    Cancer Cousin     Social History Social History[1]   Allergies   Patient has no known allergies.   Review of Systems Review of Systems  Respiratory:  Positive for cough.      Physical Exam Triage Vital Signs ED Triage Vitals  Encounter Vitals Group     BP 09/02/24 1201 (!) 169/100     Girls Systolic BP Percentile --      Girls Diastolic BP Percentile --      Boys Systolic BP Percentile --  Boys Diastolic BP Percentile --      Pulse Rate 09/02/24 1201 83     Resp 09/02/24 1201 18     Temp 09/02/24 1201 (!) 97.4 F (36.3 C)     Temp Source 09/02/24 1201 Oral     SpO2 09/02/24 1201 97 %     Weight --      Height --      Head Circumference --      Peak Flow --      Pain Score 09/02/24 1159 0     Pain Loc --      Pain Education --      Exclude from Growth Chart --    No data found.  Updated Vital Signs BP (!) 169/100 (BP Location: Left Arm)   Pulse 83   Temp (!) 97.4 F (36.3 C) (Oral)   Resp 18   SpO2 97%   Visual Acuity Right Eye Distance:   Left Eye Distance:   Bilateral Distance:    Right Eye Near:   Left Eye Near:    Bilateral Near:      Physical Exam Vitals and nursing note reviewed.  Constitutional:      General: He is not in acute distress.    Appearance: Normal appearance. He is not ill-appearing, toxic-appearing or diaphoretic.  Eyes:     General: No scleral icterus. Cardiovascular:     Rate and Rhythm: Normal rate and regular rhythm.     Heart sounds: Normal heart sounds.  Pulmonary:     Effort: Pulmonary effort is normal. No respiratory distress.     Breath sounds: Normal breath sounds. No wheezing or rhonchi.  Skin:    General: Skin is warm.  Neurological:     Mental Status: He is alert and oriented to person, place, and time.  Psychiatric:        Mood and Affect: Mood normal.        Behavior: Behavior normal.      UC Treatments / Results  Labs (all labs ordered are listed, but only abnormal results are displayed) Labs Reviewed - No data to display  EKG   Radiology No results found.  Procedures Procedures (including critical care time)  Medications Ordered in UC Medications - No data to display  Initial Impression / Assessment and Plan / UC Course  I have reviewed the triage vital signs and the nursing notes.  Pertinent labs & imaging results that were available during my care of the patient were reviewed by me and considered in my medical decision making (see chart for details).    Final Clinical Impressions(s) / UC Diagnoses   Final diagnoses:  Cough, unspecified type   Discharge Instructions   None    ED Prescriptions   None    PDMP not reviewed this encounter.     [1]  Social History Tobacco Use   Smoking status: Former    Current packs/day: 0.00    Average packs/day: 2.0 packs/day for 7.0 years (14.0 ttl pk-yrs)    Types: Cigarettes    Start date: 08/18/2002    Quit date: 08/18/2009    Years since quitting: 15.0   Smokeless tobacco: Never  Vaping Use   Vaping status: Never Used  Substance Use Topics   Alcohol use: No    Alcohol/week: 0.0 standard drinks  of alcohol    Comment: About 2-3 beers per day. 2 40 ounces a day   Drug use: No     Andra Corean BROCKS,  PA-C 09/02/24 1329  "

## 2024-09-02 NOTE — Discharge Instructions (Addendum)
 Preliminary overread shows some patchy infiltrate of lower lungs bilaterally. Will treat you for bacterial URI and will call you if radiologist says anything different.

## 2024-09-12 ENCOUNTER — Telehealth: Payer: Self-pay

## 2024-09-12 NOTE — Telephone Encounter (Signed)
 Patient called to confirm his appt tomorrow at 1030 for labs.  Informed patient of appt with Cassie, PA at 1030 on 09/20/24. He voiced understanding.

## 2024-09-13 ENCOUNTER — Inpatient Hospital Stay: Attending: Physician Assistant

## 2024-09-13 DIAGNOSIS — Z79899 Other long term (current) drug therapy: Secondary | ICD-10-CM | POA: Insufficient documentation

## 2024-09-13 DIAGNOSIS — D472 Monoclonal gammopathy: Secondary | ICD-10-CM | POA: Diagnosis present

## 2024-09-13 DIAGNOSIS — D509 Iron deficiency anemia, unspecified: Secondary | ICD-10-CM | POA: Insufficient documentation

## 2024-09-13 LAB — CBC WITH DIFFERENTIAL (CANCER CENTER ONLY)
Abs Immature Granulocytes: 0.02 K/uL (ref 0.00–0.07)
Basophils Absolute: 0 K/uL (ref 0.0–0.1)
Basophils Relative: 1 %
Eosinophils Absolute: 0.3 K/uL (ref 0.0–0.5)
Eosinophils Relative: 6 %
HCT: 40.4 % (ref 39.0–52.0)
Hemoglobin: 13.5 g/dL (ref 13.0–17.0)
Immature Granulocytes: 0 %
Lymphocytes Relative: 32 %
Lymphs Abs: 1.7 K/uL (ref 0.7–4.0)
MCH: 26.4 pg (ref 26.0–34.0)
MCHC: 33.4 g/dL (ref 30.0–36.0)
MCV: 79.1 fL — ABNORMAL LOW (ref 80.0–100.0)
Monocytes Absolute: 0.7 K/uL (ref 0.1–1.0)
Monocytes Relative: 13 %
Neutro Abs: 2.5 K/uL (ref 1.7–7.7)
Neutrophils Relative %: 48 %
Platelet Count: 270 K/uL (ref 150–400)
RBC: 5.11 MIL/uL (ref 4.22–5.81)
RDW: 15.4 % (ref 11.5–15.5)
WBC Count: 5.2 K/uL (ref 4.0–10.5)
nRBC: 0 % (ref 0.0–0.2)

## 2024-09-13 LAB — FERRITIN: Ferritin: 139 ng/mL (ref 24–336)

## 2024-09-13 LAB — CMP (CANCER CENTER ONLY)
ALT: 53 U/L — ABNORMAL HIGH (ref 0–44)
AST: 38 U/L (ref 15–41)
Albumin: 4.3 g/dL (ref 3.5–5.0)
Alkaline Phosphatase: 93 U/L (ref 38–126)
Anion gap: 9 (ref 5–15)
BUN: 6 mg/dL — ABNORMAL LOW (ref 8–23)
CO2: 26 mmol/L (ref 22–32)
Calcium: 9.6 mg/dL (ref 8.9–10.3)
Chloride: 106 mmol/L (ref 98–111)
Creatinine: 0.59 mg/dL — ABNORMAL LOW (ref 0.61–1.24)
GFR, Estimated: >60 mL/min
Glucose, Bld: 102 mg/dL — ABNORMAL HIGH (ref 70–99)
Potassium: 3.9 mmol/L (ref 3.5–5.1)
Sodium: 140 mmol/L (ref 135–145)
Total Bilirubin: 0.8 mg/dL (ref 0.0–1.2)
Total Protein: 8 g/dL (ref 6.5–8.1)

## 2024-09-13 LAB — LACTATE DEHYDROGENASE: LDH: 188 U/L (ref 105–235)

## 2024-09-13 NOTE — Progress Notes (Signed)
 [facility] OFFICE PROGRESS NOTE  Shawn Sick, MD [address] [place] [address]  DIAGNOSIS:  1) Iron  deficiency anemia 2) MGUS diagnosed in February 2025  PRIOR THERAPY: None  CURRENT THERAPY: Over the counter ferrous sulfate with vitamin C   INTERVAL HISTORY: Gregory Hernandez 64 y.o. male returns to clinic today for follow-up visit.  The patient was last seen in clinic in April 2025.  The patient is followed for his history of iron  deficiency anemia and MGUS.   The patient is feeling well today without any concerning complaints.  He is no longer taking ferrous sulfate supplement daily.  He denies any unusual fatigue. He denies any abnormal bleeding or bruising.  He denies any lymphadenopathy, fevers, chills, night sweats, or unexplained weight loss.  He denies any abdominal pain or early satiety.  He denies any unusual shortness of breath.  He denies any frequent signs and symptoms of infection.  He denies any unusual bone pain or recent fractures. He does have arthritis in his right knee though. He is here for evaluation and to review his scan results.   MEDICAL HISTORY: Past Medical History:  Diagnosis Date   Adenomatous colon polyp 12/14/2011   Dermatitis    H/O Tinea dermatitis   Elevated BP    Elevated transaminase level    Mild elevation.   Facial pain 2007   Left eye and left facial pain January 2007; evaluated by ophthalmologist Dr. Renaee; CT of face and orbits 09/16/2005 unremarkable; etiology unclear; improved by 10/28/2005.   Fracture of left shoulder 2012   Helicobacter pylori gastritis 11/16/2014   EGD done 11/01/2014 by Dr. Dianna to work up weight loss showed gastritis; biopsy showed chronic active gastritis with Helicobacter pylori organisms.  Dr. Jeronimo notes indicate plan to start treatment with Prevpac.    Rhinitis    Seasonal   TB (tuberculosis) 1996   Pulmonary TB (right upper lobe infiltrate) diagnosed May 1996; treated at [facility]   Tobacco abuse     ALLERGIES:  has no known allergies.  MEDICATIONS:  Current Outpatient Medications  Medication Sig Dispense Refill   benzonatate  (TESSALON ) 100 MG capsule Take 1 capsule (100 mg total) by mouth every 8 (eight) hours. 30 capsule 0   chlorthalidone  (HYGROTON ) 25 MG tablet TAKE 1 TABLET EVERY DAY 90 tablet 3   diclofenac  Sodium (VOLTAREN ) 1 % GEL Apply 2 g topically 4 (four) times daily. 100 g 0   iron  polysaccharides (NIFEREX) 150 MG capsule Take 1 capsule (150 mg total) by mouth daily. 30 capsule 1   pravastatin  (PRAVACHOL ) 20 MG tablet TAKE 1 TABLET BY MOUTH EVERY DAY IN THE EVENING 90 tablet 3   No current facility-administered medications for this visit.    SURGICAL HISTORY:  Past Surgical History:  Procedure Laterality Date   NO PAST SURGERIES      REVIEW OF SYSTEMS:   Review of Systems  Constitutional: Negative for appetite change, chills, fatigue, fever and unexpected weight change.  HENT: Negative for mouth sores, nosebleeds, sore throat and trouble swallowing.   Eyes: Negative for eye problems and icterus.  Respiratory: Negative for cough, hemoptysis, shortness of breath and wheezing.   Cardiovascular: Negative for chest pain and leg swelling.  Gastrointestinal: Negative for abdominal pain, constipation, diarrhea, nausea and vomiting.  Genitourinary: Negative for bladder incontinence, difficulty urinating, dysuria, frequency and hematuria.   Musculoskeletal: Negative for back pain, gait problem, neck pain and neck stiffness.  Skin: Negative  for itching and rash.  Neurological: Negative for dizziness, extremity weakness, gait problem, headaches, light-headedness and seizures.  Hematological: Negative for adenopathy. Does not bruise/bleed easily.  Psychiatric/Behavioral: Positive for stutter. Negative for confusion, depression and sleep disturbance. The patient is not nervous/anxious.     PHYSICAL EXAMINATION:  Blood pressure  (!) 147/79, pulse 88, temperature 97.7 F (36.5 C), resp. rate 20, weight 115 lb 6.4 oz (52.3 kg), SpO2 97%.  ECOG PERFORMANCE STATUS: 1  Physical Exam  Constitutional: Oriented to person, place, and time and well-developed, well-nourished, and in no distress. HENT:  Head: Normocephalic and atraumatic.  Mouth/Throat: Oropharynx is clear and moist. No oropharyngeal exudate.  Eyes: Conjunctivae are normal. Right eye exhibits no discharge. Left eye exhibits no discharge. No scleral icterus.  Neck: Normal range of motion. Neck supple.  Cardiovascular: Normal rate, regular rhythm, normal heart sounds and intact distal pulses.   Pulmonary/Chest: Effort normal and breath sounds normal. No respiratory distress. No wheezes. No rales.  Abdominal: Soft. Bowel sounds are normal. Exhibits no distension and no mass. There is no tenderness.  Musculoskeletal: Normal range of motion. Exhibits no edema.  Lymphadenopathy:    No cervical adenopathy.  Neurological: Alert and oriented to person, place, and time. Exhibits normal muscle tone. Gait normal. Coordination normal.  Skin: Skin is warm and dry. No rash noted. Not diaphoretic. No erythema. No pallor.  Psychiatric: Mood, memory and judgment normal.  Vitals reviewed.  LABORATORY DATA: Lab Results  Component Value Date   WBC 5.2 09/13/2024   HGB 13.5 09/13/2024   HCT 40.4 09/13/2024   MCV 79.1 (L) 09/13/2024   PLT 270 09/13/2024      Chemistry      Component Value Date/Time   NA 140 09/13/2024 1013   NA 141 10/18/2023 1041   K 3.9 09/13/2024 1013   CL 106 09/13/2024 1013   CO2 26 09/13/2024 1013   BUN 6 (L) 09/13/2024 1013   BUN 10 10/18/2023 1041   CREATININE 0.59 (L) 09/13/2024 1013   CREATININE 0.58 09/19/2014 1046      Component Value Date/Time   CALCIUM 9.6 09/13/2024 1013   ALKPHOS 93 09/13/2024 1013   AST 38 09/13/2024 1013   ALT 53 (H) 09/13/2024 1013   BILITOT 0.8 09/13/2024 1013       RADIOGRAPHIC STUDIES:  DG Chest  2 View Result Date: 09/02/2024 EXAM: 2 VIEW(S) XRAY OF THE CHEST 09/02/2024 12:15:07 PM COMPARISON: CXR 12/24/2019. CLINICAL HISTORY: cough FINDINGS: LUNGS AND PLEURA: No focal pulmonary opacity. No pleural effusion. No pneumothorax. HEART AND MEDIASTINUM: No acute abnormality of the cardiac and mediastinal silhouettes. BONES AND SOFT TISSUES: No acute osseous abnormality. IMPRESSION: 1. No acute process. Electronically signed by: Morgane Naveau MD 09/02/2024 01:29 PM EST RP Workstation: [id number]     ASSESSMENT/PLAN:  This is a very pleasant 64 year old African-American male with MGUS and iron  deficiency anemia.   The patient is taking ferrous sulfate p.o. daily with vitamin C.   The patient was seen with Dr. Sherrod today.  Dr. Sherrod reviewed his myeloma labs.  Dr. Sherrod reviewed that this appears stable and would recommend repeat labs in 6 months.   The patient will have labs at that time and we will see the patient back 1 week later.   I will add iron  studies to his next lab draw given his history but his CBC is WNL today.   The patient was advised to call immediately if she has any concerning symptoms in the interval. The  patient voices understanding of current disease status and treatment options and is in agreement with the current care plan. All questions were answered. The patient knows to call the clinic with any problems, questions or concerns. We can certainly see the patient much sooner if necessary    Orders Placed This Encounter  Procedures   CBC with Differential ([facility] Center Only)    Standing Status:   Future    Expected Date:   03/20/2025    Expiration Date:   09/20/2025   Ferritin    Standing Status:   Future    Expected Date:   03/20/2025    Expiration Date:   09/20/2025   CMP (Cancer Center only)    Standing Status:   Future    Expected Date:   03/20/2025    Expiration Date:   09/20/2025   Iron  and Iron  Binding Capacity ([facility] only)    Standing Status:    Future    Expected Date:   03/20/2025    Expiration Date:   09/20/2025   Multiple Myeloma Panel (SPEP&IFE w/QIG)    Standing Status:   Future    Expected Date:   03/20/2025    Expiration Date:   09/20/2025   Lactate dehydrogenase (LDH)    Standing Status:   Future    Expected Date:   03/20/2025    Expiration Date:   09/20/2025   Beta 2 microglobulin, serum    Standing Status:   Future    Expected Date:   03/20/2025    Expiration Date:   09/20/2025    Raman Featherston L Paolo Okane, PA-C 09/20/2024  ADDENDUM: Hematology/Oncology Attending: I had a face-to-face encounter with the patient today.  I reviewed his record, lab and recommended his care plan.  This is a very pleasant 64 years old African-American male with history of MGUS as well as iron  deficiency anemia.  He is currently on over-the-counter ferrous sulfate and tolerating it fairly well.  The patient is feeling fine today with no concerning complaints.  He had repeat iron  study and myeloma panel performed recently and is here for evaluation and discussion of his lab results.  His laboratory workup showed no concerning findings for disease progression of the MGUS to multiple myeloma. I recommended for him to continue on observation with repeat myeloma panel and iron  study in 6 months. He was advised to call immediately if he has any other concerning symptoms in the interval. Disclaimer: This note was dictated with voice recognition software. Similar sounding words can inadvertently be transcribed and may be missed upon review. Sherrod MARLA Sherrod, MD

## 2024-09-14 LAB — BETA 2 MICROGLOBULIN, SERUM: Beta-2 Microglobulin: 1.2 mg/L (ref 0.6–2.4)

## 2024-09-14 LAB — KAPPA/LAMBDA LIGHT CHAINS
Kappa free light chain: 25.5 mg/L — ABNORMAL HIGH (ref 3.3–19.4)
Kappa, lambda light chain ratio: 2.09 — ABNORMAL HIGH (ref 0.26–1.65)
Lambda free light chains: 12.2 mg/L (ref 5.7–26.3)

## 2024-09-15 LAB — MULTIPLE MYELOMA PANEL, SERUM
Albumin SerPl Elph-Mcnc: 3.7 g/dL (ref 2.9–4.4)
Albumin/Glob SerPl: 1.1 (ref 0.7–1.7)
Alpha 1: 0.2 g/dL (ref 0.0–0.4)
Alpha2 Glob SerPl Elph-Mcnc: 0.7 g/dL (ref 0.4–1.0)
B-Globulin SerPl Elph-Mcnc: 1.2 g/dL (ref 0.7–1.3)
Gamma Glob SerPl Elph-Mcnc: 1.6 g/dL (ref 0.4–1.8)
Globulin, Total: 3.7 g/dL (ref 2.2–3.9)
IgA: 374 mg/dL (ref 61–437)
IgG (Immunoglobin G), Serum: 1683 mg/dL — ABNORMAL HIGH (ref 603–1613)
IgM (Immunoglobulin M), Srm: 86 mg/dL (ref 20–172)
M Protein SerPl Elph-Mcnc: 0.9 g/dL — ABNORMAL HIGH
Total Protein ELP: 7.4 g/dL (ref 6.0–8.5)

## 2024-09-20 ENCOUNTER — Inpatient Hospital Stay (HOSPITAL_BASED_OUTPATIENT_CLINIC_OR_DEPARTMENT_OTHER): Admitting: Physician Assistant

## 2024-09-20 VITALS — BP 147/79 | HR 88 | Temp 97.7°F | Resp 20 | Wt 115.4 lb

## 2024-09-20 DIAGNOSIS — D508 Other iron deficiency anemias: Secondary | ICD-10-CM | POA: Diagnosis not present

## 2024-09-20 DIAGNOSIS — D509 Iron deficiency anemia, unspecified: Secondary | ICD-10-CM | POA: Diagnosis not present

## 2024-09-20 DIAGNOSIS — D472 Monoclonal gammopathy: Secondary | ICD-10-CM

## 2024-09-21 ENCOUNTER — Ambulatory Visit: Payer: Self-pay

## 2024-09-21 VITALS — BP 151/91 | HR 73 | Temp 97.7°F | Ht 63.0 in | Wt 115.0 lb

## 2024-09-21 DIAGNOSIS — E041 Nontoxic single thyroid nodule: Secondary | ICD-10-CM

## 2024-09-21 DIAGNOSIS — R2232 Localized swelling, mass and lump, left upper limb: Secondary | ICD-10-CM

## 2024-09-21 DIAGNOSIS — E0590 Thyrotoxicosis, unspecified without thyrotoxic crisis or storm: Secondary | ICD-10-CM

## 2024-09-21 DIAGNOSIS — I739 Peripheral vascular disease, unspecified: Secondary | ICD-10-CM

## 2024-09-21 DIAGNOSIS — E785 Hyperlipidemia, unspecified: Secondary | ICD-10-CM | POA: Diagnosis not present

## 2024-09-21 DIAGNOSIS — I10 Essential (primary) hypertension: Secondary | ICD-10-CM | POA: Diagnosis not present

## 2024-09-21 DIAGNOSIS — Z79899 Other long term (current) drug therapy: Secondary | ICD-10-CM | POA: Diagnosis not present

## 2024-09-21 DIAGNOSIS — F1010 Alcohol abuse, uncomplicated: Secondary | ICD-10-CM

## 2024-09-21 DIAGNOSIS — F1090 Alcohol use, unspecified, uncomplicated: Secondary | ICD-10-CM

## 2024-09-21 DIAGNOSIS — E7800 Pure hypercholesterolemia, unspecified: Secondary | ICD-10-CM

## 2024-09-21 MED ORDER — CHLORTHALIDONE 25 MG PO TABS: 25.0000 mg | ORAL_TABLET | Freq: Every day | ORAL | 3 refills | Status: AC

## 2024-09-21 NOTE — Patient Instructions (Signed)
 Today we discussed the following medical conditions and plan:   For your blood pressure, check your blood pressure every day. One of the nurses at the clinic will check your blood pressure in about two weeks and we can make any adjustments as needed. Please bring in your blood pressure cuff in at the next visit   We will check you thyroid  labs, your cholesterol and kidney function. I will call you with the results  Please continue to work on not drinking anymore, and if you need help quitting, then please let us  know   We look forward to seeing you next time. Please call our clinic at [phone] if you have any questions or concerns. The best time to call is Monday-Friday from 9am-4pm, but there is someone available 24/7. If you need medication refills, please notify your pharmacy one week in advance and they will send us  a request.   Thank you for trusting me with your care. Wishing you the best!   Allanna Bresee D'Mello, DO  [facility]

## 2024-09-21 NOTE — Progress Notes (Signed)
 "  Established Patient Office Visit  Subjective   Patient ID: Gregory Hernandez, male    DOB: 10/15/1960  Age: 64 y.o. MRN: [id number]  Chief Complaint  Patient presents with   Follow-up    HPI Patient is a 64 year old male with PMH of alcohol use disorder, HLD, HTN, MGUS, subclinical hyperthyroidism that presents today for follow-up of chronic conditions.  See problem based plan and assessment for more details.     ROS See problem based plan assessment for more details.   Objective:     BP (!) 151/91 (BP Location: Left Arm, Patient Position: Sitting, Cuff Size: Normal)   Pulse 73   Temp 97.7 F (36.5 C) (Oral)   Ht 5' 3 (1.6 m)   Wt 115 lb (52.2 kg)   SpO2 96%   BMI 20.37 kg/m  BP Readings from Last 3 Encounters:  09/21/24 (!) 151/91  09/20/24 (!) 147/79  09/02/24 (!) 169/100   Wt Readings from Last 3 Encounters:  09/21/24 115 lb (52.2 kg)  09/20/24 115 lb 6.4 oz (52.3 kg)  03/29/24 94 lb 14.4 oz (43 kg)      Physical Exam Constitutional:      General: He is not in acute distress.    Appearance: He is not toxic-appearing.  Cardiovascular:     Comments: Palpable DP pulses bilaterally  Pulmonary:     Effort: Pulmonary effort is normal. No respiratory distress.     Breath sounds: No wheezing.  Musculoskeletal:     Comments: Mobile non tender mass on left shoulder   Skin:    Comments: No open wounds noted on right foot   Neurological:     Mental Status: He is alert.      No results found for any visits on 09/21/24.  Last thyroid  functions Lab Results  Component Value Date   TSH 0.146 (L) 10/18/2023   FREET4 0.95 10/18/2023      The 10-year ASCVD risk score (Arnett DK, et al., 2019) is: 17.6%    Assessment & Plan:   Problem List Items Addressed This Visit       Cardiovascular and Mediastinum   Hypertension (Chronic)   Patient reports that he has been out of chlorthalidone .  He states that he has not been taking this medication due to a home  health nurse or aide that recommended stopping it due to low BP at home.  Patient does have blood pressure cuff at home.  Did recommend daily readings.  Will ask him to be scheduled for BP visit and for him to bring in his blood pressure cuff at that time.  Blood pressure at this visit was elevated and has history of being elevated  Plan Continue chlorthalidone  25 mg BP only visit and blood pressure cuff check BMP       Relevant Medications   chlorthalidone  (HYGROTON ) 25 MG tablet   Other Relevant Orders   Basic metabolic panel with GFR   Insufficiency, arterial, peripheral   Patient had quantflo performed last year and results showed Left foot 1.15, right foot showed mild borderline circulation low 0.87. Patient has good DP pulses bilaterally and no signs on right foot of open wounds. Extremities are warm. Patient denies any calf pain, or leg weakness.    Plan: Continue to monitor       Relevant Medications   chlorthalidone  (HYGROTON ) 25 MG tablet     Endocrine   Subclinical hyperthyroidism - Primary (Chronic)   Patient denies any symptoms such as  hyperthermia, diaphoresis, weight loss, agitation.  Was noted to have ultrasound of thyroid  last year that showed right thyroid  nodule and multinodular thyroid .  Right thyroid  nodule was recommended for repeat ultrasound in 1 year.  Will check thyroid  levels today and order ultrasound.  Plan: TSH plus free T4 Ultrasound thyroid        Relevant Orders   TSH + free T4   US  THYROID    Thyroid  nodule (Chronic)   Right thyroid  nodule consistent with TI-RADS 4.  Recommended repeat ultrasound in 1 year.  This is ordered.        Other   Hyperlipidemia (Chronic)   On pravastatin  20 mg   Plan: Check lipid panel       Relevant Medications   chlorthalidone  (HYGROTON ) 25 MG tablet   Other Relevant Orders   Lipid Profile   Alcohol Use Disorder (Chronic)   Patient states that he drinks about 4 to 5 days out of the week.  Has 1 beer  each of those days.  Did discuss and encouraged alcohol cessation.  Patient reports that he believes that he can quit on his own.  Did offer help with medications or behavioral health counseling, but patient declined at this time.  Plan: Continue to encourage alcohol cessation.      Arm mass, left   Patient states that this appears to be stable.  No drainage noted or fever or chills.  Likely a lipoma.  Continue to monitor.        Return in about 2 weeks (around 10/05/2024) for Blood pressure RN visit .    Marcas Bowsher D'Mello, DO Patient discussed with Dr. Jeanelle "

## 2024-09-22 DIAGNOSIS — I739 Peripheral vascular disease, unspecified: Secondary | ICD-10-CM | POA: Insufficient documentation

## 2024-09-22 NOTE — Assessment & Plan Note (Signed)
 Patient states that he drinks about 4 to 5 days out of the week.  Has 1 beer each of those days.  Did discuss and encouraged alcohol cessation.  Patient reports that he believes that he can quit on his own.  Did offer help with medications or behavioral health counseling, but patient declined at this time.  Plan: Continue to encourage alcohol cessation.

## 2024-09-22 NOTE — Assessment & Plan Note (Signed)
 On pravastatin  20 mg   Plan: Check lipid panel

## 2024-09-22 NOTE — Assessment & Plan Note (Signed)
 Patient states that this appears to be stable.  No drainage noted or fever or chills.  Likely a lipoma.  Continue to monitor.

## 2024-09-22 NOTE — Assessment & Plan Note (Signed)
 Patient had quantflo performed last year and results showed Left foot 1.15, right foot showed mild borderline circulation low 0.87. Patient has good DP pulses bilaterally and no signs on right foot of open wounds. Extremities are warm. Patient denies any calf pain, or leg weakness.    Plan: Continue to monitor

## 2024-09-22 NOTE — Assessment & Plan Note (Signed)
 Patient denies any symptoms such as hyperthermia, diaphoresis, weight loss, agitation.  Was noted to have ultrasound of thyroid  last year that showed right thyroid  nodule and multinodular thyroid .  Right thyroid  nodule was recommended for repeat ultrasound in 1 year.  Will check thyroid  levels today and order ultrasound.  Plan: TSH plus free T4 Ultrasound thyroid 

## 2024-09-22 NOTE — Assessment & Plan Note (Addendum)
 Patient reports that he has been out of chlorthalidone .  He states that he has not been taking this medication due to a home health nurse or aide that recommended stopping it due to low BP at home.  Patient does have blood pressure cuff at home.  Did recommend daily readings.  Will ask him to be scheduled for BP visit and for him to bring in his blood pressure cuff at that time.  Blood pressure at this visit was elevated and has history of being elevated  Plan Continue chlorthalidone  25 mg BP only visit and blood pressure cuff check BMP

## 2024-09-22 NOTE — Assessment & Plan Note (Signed)
 Right thyroid  nodule consistent with TI-RADS 4.  Recommended repeat ultrasound in 1 year.  This is ordered.

## 2024-09-26 NOTE — Progress Notes (Signed)
 [facility] Attending  Case discussed with the resident at the time of the visit.  We reviewed the resident's history and exam and pertinent patient test results.  I agree with the assessment, diagnosis, and plan of care documented in the resident's note.

## 2024-10-04 ENCOUNTER — Ambulatory Visit
Admission: RE | Admit: 2024-10-04 | Discharge: 2024-10-04 | Disposition: A | Source: Ambulatory Visit | Attending: Family Medicine | Admitting: Family Medicine

## 2024-10-04 DIAGNOSIS — E0590 Thyrotoxicosis, unspecified without thyrotoxic crisis or storm: Secondary | ICD-10-CM

## 2024-10-05 ENCOUNTER — Ambulatory Visit: Payer: Self-pay | Admitting: *Deleted

## 2024-10-05 ENCOUNTER — Ambulatory Visit: Payer: Self-pay

## 2024-10-05 DIAGNOSIS — E0590 Thyrotoxicosis, unspecified without thyrotoxic crisis or storm: Secondary | ICD-10-CM

## 2024-10-05 DIAGNOSIS — E7800 Pure hypercholesterolemia, unspecified: Secondary | ICD-10-CM

## 2024-10-05 DIAGNOSIS — I10 Essential (primary) hypertension: Secondary | ICD-10-CM

## 2024-10-05 NOTE — Progress Notes (Signed)
" ° ° °  Gregory Hernandez presented today for blood pressure check. Patient is prescribed blood pressure medications and I confirmed that patient did take their blood pressure medication prior to todays appointment. Blood pressure was taken in the usual and appropriate manner using an automated BP cuff.     Vitals:   10/05/24 1118  BP: 129/87  P- 96    Results of todays visit will be routed to Dr. Kem for review and further management. Patient was asked to write down his blood pressures that he takes at home and to bring in his blood pressure machine to his next visit.    Patient fell on yesterday.  Dr. Kem came in to check patient's knee.  Right knee pain level 2 when walking and bending knee. .  Stated keep him awake on last night.  Patient was advised per Dr. Kem to use his Voltaren  Gel and to call if pain worsens for an appointment.  "

## 2024-10-06 NOTE — Progress Notes (Signed)
 This visit is not a full evaluation of patient's knee pain. Did inform him that if it continued to bother him, he will need to schedule a visit

## 2024-10-07 LAB — BASIC METABOLIC PANEL WITH GFR
BUN/Creatinine Ratio: 12 (ref 10–24)
BUN: 9 mg/dL (ref 8–27)
CO2: 22 mmol/L (ref 20–29)
Calcium: 10.2 mg/dL (ref 8.6–10.2)
Chloride: 92 mmol/L — ABNORMAL LOW (ref 96–106)
Creatinine, Ser: 0.76 mg/dL (ref 0.76–1.27)
Glucose: 94 mg/dL (ref 70–99)
Potassium: 3.5 mmol/L (ref 3.5–5.2)
Sodium: 134 mmol/L (ref 134–144)
eGFR: 100 mL/min/{1.73_m2}

## 2024-10-07 LAB — LIPID PANEL
Chol/HDL Ratio: 2.9 ratio (ref 0.0–5.0)
Cholesterol, Total: 161 mg/dL (ref 100–199)
HDL: 56 mg/dL
LDL Chol Calc (NIH): 88 mg/dL (ref 0–99)
Triglycerides: 90 mg/dL (ref 0–149)
VLDL Cholesterol Cal: 17 mg/dL (ref 5–40)

## 2024-10-07 LAB — TSH+FREE T4
Free T4: 1.89 ng/dL — ABNORMAL HIGH (ref 0.82–1.77)
TSH: 0.015 u[IU]/mL — ABNORMAL LOW (ref 0.450–4.500)

## 2024-10-25 ENCOUNTER — Ambulatory Visit: Payer: 59

## 2025-03-20 ENCOUNTER — Inpatient Hospital Stay

## 2025-03-27 ENCOUNTER — Inpatient Hospital Stay: Admitting: Physician Assistant
